# Patient Record
Sex: Male | Born: 1955 | Race: White | Hispanic: No | Marital: Single | Smoking: Never smoker
Health system: Southern US, Community
[De-identification: ages and names within clinical notes are randomized; demographics above are authoritative.]

## PROBLEM LIST (undated history)

## (undated) DIAGNOSIS — E876 Hypokalemia: Secondary | ICD-10-CM

## (undated) DIAGNOSIS — I5022 Chronic systolic (congestive) heart failure: Secondary | ICD-10-CM

## (undated) DIAGNOSIS — R7989 Other specified abnormal findings of blood chemistry: Secondary | ICD-10-CM

## (undated) DIAGNOSIS — I4819 Other persistent atrial fibrillation: Secondary | ICD-10-CM

## (undated) DIAGNOSIS — I4891 Unspecified atrial fibrillation: Secondary | ICD-10-CM

## (undated) DIAGNOSIS — I10 Essential (primary) hypertension: Secondary | ICD-10-CM

## (undated) DIAGNOSIS — L409 Psoriasis, unspecified: Secondary | ICD-10-CM

## (undated) DIAGNOSIS — I160 Hypertensive urgency: Secondary | ICD-10-CM

## (undated) HISTORY — DX: Hypertensive urgency: I16.0

## (undated) HISTORY — DX: Unspecified atrial fibrillation: I48.91

## (undated) HISTORY — DX: Hypokalemia: E87.6

## (undated) HISTORY — PX: KNEE ARTHROCENTESIS: SUR44

## (undated) HISTORY — DX: Other specified abnormal findings of blood chemistry: R79.89

## (undated) HISTORY — DX: Chronic systolic (congestive) heart failure: I50.22

---

## 2001-02-24 ENCOUNTER — Encounter: Admission: RE | Admit: 2001-02-24 | Discharge: 2001-02-24 | Payer: Self-pay

## 2001-02-26 ENCOUNTER — Ambulatory Visit (HOSPITAL_BASED_OUTPATIENT_CLINIC_OR_DEPARTMENT_OTHER): Admission: RE | Admit: 2001-02-26 | Discharge: 2001-02-26 | Payer: Self-pay

## 2016-12-28 ENCOUNTER — Encounter (HOSPITAL_BASED_OUTPATIENT_CLINIC_OR_DEPARTMENT_OTHER): Payer: Self-pay

## 2016-12-28 ENCOUNTER — Observation Stay (HOSPITAL_BASED_OUTPATIENT_CLINIC_OR_DEPARTMENT_OTHER)
Admission: EM | Admit: 2016-12-28 | Discharge: 2016-12-29 | Disposition: A | Discharge status: Home / Self Care | Payer: Self-pay | Attending: Internal Medicine | Admitting: Internal Medicine

## 2016-12-28 ENCOUNTER — Emergency Department (HOSPITAL_BASED_OUTPATIENT_CLINIC_OR_DEPARTMENT_OTHER): Payer: Self-pay

## 2016-12-28 DIAGNOSIS — I4891 Unspecified atrial fibrillation: Principal | ICD-10-CM | POA: Insufficient documentation

## 2016-12-28 DIAGNOSIS — I4819 Other persistent atrial fibrillation: Secondary | ICD-10-CM | POA: Insufficient documentation

## 2016-12-28 DIAGNOSIS — I5022 Chronic systolic (congestive) heart failure: Secondary | ICD-10-CM

## 2016-12-28 DIAGNOSIS — R778 Other specified abnormalities of plasma proteins: Secondary | ICD-10-CM | POA: Diagnosis present

## 2016-12-28 DIAGNOSIS — I160 Hypertensive urgency: Secondary | ICD-10-CM | POA: Diagnosis present

## 2016-12-28 DIAGNOSIS — R7989 Other specified abnormal findings of blood chemistry: Secondary | ICD-10-CM | POA: Diagnosis present

## 2016-12-28 DIAGNOSIS — E876 Hypokalemia: Secondary | ICD-10-CM

## 2016-12-28 HISTORY — DX: Other persistent atrial fibrillation: I48.19

## 2016-12-28 HISTORY — DX: Other specified abnormalities of plasma proteins: R77.8

## 2016-12-28 HISTORY — DX: Essential (primary) hypertension: I10

## 2016-12-28 HISTORY — DX: Hypokalemia: E87.6

## 2016-12-28 HISTORY — DX: Unspecified atrial fibrillation: I48.91

## 2016-12-28 HISTORY — DX: Hypertensive urgency: I16.0

## 2016-12-28 HISTORY — DX: Psoriasis, unspecified: L40.9

## 2016-12-28 LAB — CBC WITH DIFFERENTIAL/PLATELET
BASOS PCT: 1 %
Basophils Absolute: 0.1 10*3/uL (ref 0.0–0.1)
EOS ABS: 0.2 10*3/uL (ref 0.0–0.7)
EOS PCT: 3 %
HCT: 48.8 % (ref 39.0–52.0)
Hemoglobin: 16.7 g/dL (ref 13.0–17.0)
Lymphocytes Relative: 27 %
Lymphs Abs: 2.3 10*3/uL (ref 0.7–4.0)
MCH: 28.7 pg (ref 26.0–34.0)
MCHC: 34.2 g/dL (ref 30.0–36.0)
MCV: 83.8 fL (ref 78.0–100.0)
MONO ABS: 0.8 10*3/uL (ref 0.1–1.0)
MONOS PCT: 9 %
NEUTROS PCT: 60 %
Neutro Abs: 5.1 10*3/uL (ref 1.7–7.7)
PLATELETS: 279 10*3/uL (ref 150–400)
RBC: 5.82 MIL/uL — ABNORMAL HIGH (ref 4.22–5.81)
RDW: 14.3 % (ref 11.5–15.5)
WBC: 8.5 10*3/uL (ref 4.0–10.5)

## 2016-12-28 LAB — COMPREHENSIVE METABOLIC PANEL
ALBUMIN: 4.4 g/dL (ref 3.5–5.0)
ALT: 27 U/L (ref 17–63)
ANION GAP: 8 (ref 5–15)
AST: 31 U/L (ref 15–41)
Alkaline Phosphatase: 96 U/L (ref 38–126)
BUN: 15 mg/dL (ref 6–20)
CO2: 31 mmol/L (ref 22–32)
Calcium: 9.5 mg/dL (ref 8.9–10.3)
Chloride: 100 mmol/L — ABNORMAL LOW (ref 101–111)
Creatinine, Ser: 1.08 mg/dL (ref 0.61–1.24)
GFR calc non Af Amer: >60 mL/min (ref >60)
GLUCOSE: 105 mg/dL — AB (ref 65–99)
POTASSIUM: 3.4 mmol/L — AB (ref 3.5–5.1)
SODIUM: 139 mmol/L (ref 135–145)
Total Bilirubin: 1.8 mg/dL — ABNORMAL HIGH (ref 0.3–1.2)
Total Protein: 7.7 g/dL (ref 6.5–8.1)

## 2016-12-28 LAB — HEPARIN LEVEL (UNFRACTIONATED): HEPARIN UNFRACTIONATED: 0.39 [IU]/mL (ref 0.30–0.70)

## 2016-12-28 LAB — TROPONIN I
TROPONIN I: 0.07 ng/mL — AB (ref <0.03)
Troponin I: 0.06 ng/mL (ref <0.03)

## 2016-12-28 LAB — TSH: TSH: 1.911 u[IU]/mL (ref 0.350–4.500)

## 2016-12-28 LAB — MAGNESIUM: Magnesium: 1.8 mg/dL (ref 1.7–2.4)

## 2016-12-28 MED ORDER — HEPARIN BOLUS VIA INFUSION
5000.0000 [IU] | Freq: Once | INTRAVENOUS | Status: AC
  Administered 2016-12-28: 5000 [IU] via INTRAVENOUS

## 2016-12-28 MED ORDER — ADULT MULTIVITAMIN W/MINERALS CH
1.0000 | ORAL_TABLET | Freq: Every day | ORAL | Status: DC
  Filled 2016-12-28: qty 1

## 2016-12-28 MED ORDER — METOPROLOL TARTRATE 25 MG PO TABS
25.0000 mg | ORAL_TABLET | Freq: Two times a day (BID) | ORAL | Status: DC
  Administered 2016-12-29 (×2): 25 mg via ORAL
  Filled 2016-12-28 (×2): qty 1

## 2016-12-28 MED ORDER — VITAMIN D 1000 UNITS PO TABS
1000.0000 [IU] | ORAL_TABLET | Freq: Every day | ORAL | Status: DC
  Administered 2016-12-29: 1000 [IU] via ORAL
  Filled 2016-12-28: qty 1

## 2016-12-28 MED ORDER — HYDRALAZINE HCL 20 MG/ML IJ SOLN
10.0000 mg | INTRAMUSCULAR | Status: DC | PRN
  Administered 2016-12-28 – 2016-12-29 (×2): 10 mg via INTRAVENOUS
  Filled 2016-12-28 (×2): qty 1

## 2016-12-28 MED ORDER — POTASSIUM CHLORIDE CRYS ER 20 MEQ PO TBCR
40.0000 meq | EXTENDED_RELEASE_TABLET | Freq: Once | ORAL | Status: AC
  Administered 2016-12-28: 40 meq via ORAL
  Filled 2016-12-28: qty 2

## 2016-12-28 MED ORDER — ACETAMINOPHEN 325 MG PO TABS
650.0000 mg | ORAL_TABLET | ORAL | Status: DC | PRN
Start: 2016-12-28 — End: 2016-12-29

## 2016-12-28 MED ORDER — HYDROCODONE-ACETAMINOPHEN 5-325 MG PO TABS: 1.0000 | ORAL_TABLET | ORAL | Status: DC | PRN

## 2016-12-28 MED ORDER — ZOLPIDEM TARTRATE 5 MG PO TABS: 5.0000 mg | ORAL_TABLET | Freq: Every evening | ORAL | Status: DC | PRN

## 2016-12-28 MED ORDER — ONDANSETRON HCL 4 MG/2ML IJ SOLN: 4.0000 mg | Freq: Four times a day (QID) | INTRAMUSCULAR | Status: DC | PRN

## 2016-12-28 MED ORDER — SODIUM CHLORIDE 0.9% FLUSH: 3.0000 mL | INTRAVENOUS | Status: DC | PRN

## 2016-12-28 MED ORDER — METOPROLOL TARTRATE 5 MG/5ML IV SOLN
2.5000 mg | Freq: Once | INTRAVENOUS | Status: AC
  Administered 2016-12-28: 2.5 mg via INTRAVENOUS
  Filled 2016-12-28: qty 5

## 2016-12-28 MED ORDER — SODIUM CHLORIDE 0.9 % IV SOLN: 250.0000 mL | INTRAVENOUS | Status: DC | PRN

## 2016-12-28 MED ORDER — SODIUM CHLORIDE 0.9% FLUSH
3.0000 mL | Freq: Two times a day (BID) | INTRAVENOUS | Status: DC
  Administered 2016-12-28: 3 mL via INTRAVENOUS

## 2016-12-28 MED ORDER — ALPRAZOLAM 0.25 MG PO TABS: 0.2500 mg | ORAL_TABLET | Freq: Two times a day (BID) | ORAL | Status: DC | PRN

## 2016-12-28 MED ORDER — HEPARIN (PORCINE) IN NACL 100-0.45 UNIT/ML-% IJ SOLN
1500.0000 [IU]/h | INTRAMUSCULAR | Status: DC
  Administered 2016-12-28 (×2): 1500 [IU]/h via INTRAVENOUS
  Filled 2016-12-28 (×2): qty 250

## 2016-12-28 MED ORDER — METOPROLOL TARTRATE 50 MG PO TABS
25.0000 mg | ORAL_TABLET | Freq: Once | ORAL | Status: AC
  Administered 2016-12-28: 25 mg via ORAL
  Filled 2016-12-28: qty 1

## 2016-12-28 NOTE — ED Notes (Signed)
Report given to [facility] and nurse Sam, at [facility]

## 2016-12-28 NOTE — H&P (Addendum)
History and Physical    Cory [name] YFV:[id number] DOB: Apr 16, 1956 DOA: 12/28/2016  PCP: Patient, No Pcp Per confirmed  Patient coming from: Home, by way of [facility] ED  Chief Complaint: Fatigue, DOE   HPI: Cory [name] is a 61 y.o. male who denies any significant past medical history, now presenting to the emergency department for evaluation of exertional dyspnea, fatigue, and cough. He reports progressive exertional dyspnea and fatigue over the past several months, particularly worse over the past 10 days or so. He is also developed a cough productive of clear sputum over that interval. He was evaluated in an urgent care earlier today, noted to be in atrial fibrillation and very hypertensive, and was directed to the ED for further evaluation. He denies any recent fevers or chills, denies chest pain, and denies palpitations. There is no history of atrial fibrillation. Denies alcohol use. Takes vitamins, but no other medications. Has never been diagnosed with hypertension.  [facility] ED Course: Upon arrival to the [facility] ED, patient is found to be afebrile, saturating well on room air, hypertensive to 190/150, and with vitals otherwise stable. EKG features atrial fibrillation with left anterior fascicular block. Chest x-ray is negative for acute cardiopulmonary disease. Chemistry panels notable for a potassium of 3.4 and total bilirubin of 1.8. CBC is unremarkable. Troponin is slightly elevated to 0.06. Patient was started on heparin infusion in the ED, treated with 2.5 mg IV Lopressor, and later with 25 mg oral Lopressor. Cardiology was consulted by the ED physician and recommended a medical admission for further evaluation. Patient's heart rate remained in the 90s to low 100s and his blood pressure remained elevated, but improved after the beta blocker. There has not been any chest pain or respiratory distress. Patient will be observed in the telemetry unit for ongoing evaluation and  management of new onset atrial fibrillation and hypertension with hypertensive urgency and a mild elevation in troponin without chest pain.  Review of Systems:  All other systems reviewed and apart from HPI, are negative.  Past Medical History:  Diagnosis Date  . Medical history non-contributory     Past Surgical History:  Procedure Laterality Date  . KNEE ARTHROCENTESIS       reports that he has never smoked. He has never used smokeless tobacco. He reports that he does not drink alcohol or use drugs.  No Known Allergies  Family History  Problem Relation Age of Onset  . Diabetes Mother   . GI Disease Mother   . Obesity Sister      Prior to Admission medications   Medication Sig Start Date End Date Taking? Authorizing Provider  cholecalciferol (VITAMIN D) 1000 units tablet Take 1,000 Units by mouth daily.   Yes [provider]  Multiple Vitamin (MULTIVITAMIN WITH MINERALS) TABS tablet Take 1 tablet by mouth daily.   Yes [provider]    Physical Exam: Vitals:   12/28/16 1712 12/28/16 1730 12/28/16 1745 12/28/16 1845  BP: (!) 165/127 (!) 159/143 (!) 168/126 (!) 161/132  Pulse: 87 90 88 93  Resp: (!) 25 (!) 25 (!) 22 16  Temp: 98.3 F (36.8 C)  98.3 F (36.8 C) 98.8 F (37.1 C)  TempSrc: Oral  Oral Oral  SpO2: 96% 99% 98% 98%  Weight:    112.8 kg (248 lb 11.2 oz)  Height:    6\' 1"  (1.854 m)      Constitutional: NAD, calm, comfortable Eyes: PERTLA, lids and conjunctivae normal ENMT: Mucous  membranes are moist. Posterior pharynx clear of any exudate or lesions.   Neck: normal, supple, no masses, no thyromegaly Respiratory: clear to auscultation bilaterally, no wheezing, no crackles. Normal respiratory effort.   Cardiovascular: Rate ~100 and irregular. No extremity edema. No significant JVD. Abdomen: No distension, no tenderness, no masses palpated. Bowel sounds normal.  Musculoskeletal: no clubbing / cyanosis. No joint deformity upper and lower  extremities.  Skin: no significant rashes, lesions, ulcers. Warm, dry, well-perfused. Neurologic: CN 2-12 grossly intact. Sensation intact, DTR normal. Strength 5/5 in all 4 limbs.  Psychiatric: Alert and oriented x 3. Calm, cooperative.     Labs on Admission: I have personally reviewed following labs and imaging studies  CBC:  Recent Labs Lab 12/28/16 1127  WBC 8.5  NEUTROABS 5.1  HGB 16.7  HCT 48.8  MCV 83.8  PLT 279   Basic Metabolic Panel:  Recent Labs Lab 12/28/16 1127  NA 139  K 3.4*  CL 100*  CO2 31  GLUCOSE 105*  BUN 15  CREATININE 1.08  CALCIUM 9.5   GFR: Estimated Creatinine Clearance: 95.8 mL/min (by C-G formula based on SCr of 1.08 mg/dL). Liver Function Tests:  Recent Labs Lab 12/28/16 1127  AST 31  ALT 27  ALKPHOS 96  BILITOT 1.8*  PROT 7.7  ALBUMIN 4.4   No results for input(s): LIPASE, AMYLASE in the last 168 hours. No results for input(s): AMMONIA in the last 168 hours. Coagulation Profile: No results for input(s): INR, PROTIME in the last 168 hours. Cardiac Enzymes:  Recent Labs Lab 12/28/16 1127  TROPONINI 0.06*   BNP (last 3 results) No results for input(s): PROBNP in the last 8760 hours. HbA1C: No results for input(s): HGBA1C in the last 72 hours. CBG: No results for input(s): GLUCAP in the last 168 hours. Lipid Profile: No results for input(s): CHOL, HDL, LDLCALC, TRIG, CHOLHDL, LDLDIRECT in the last 72 hours. Thyroid Function Tests: No results for input(s): TSH, T4TOTAL, FREET4, T3FREE, THYROIDAB in the last 72 hours. Anemia Panel: No results for input(s): VITAMINB12, FOLATE, FERRITIN, TIBC, IRON, RETICCTPCT in the last 72 hours. Urine analysis: No results found for: COLORURINE, APPEARANCEUR, LABSPEC, PHURINE, GLUCOSEU, HGBUR, BILIRUBINUR, KETONESUR, PROTEINUR, UROBILINOGEN, NITRITE, LEUKOCYTESUR Sepsis Labs: @LABRCNTIP (procalcitonin:4,lacticidven:4) )No results found for this or any previous visit (from the past 240  hour(s)).   Radiological Exams on Admission: Dg Chest 2 View  Result Date: 12/28/2016 CLINICAL DATA:  Cough EXAM: CHEST  2 VIEW COMPARISON:  None. FINDINGS: Lungs are clear.  No pleural effusion or pneumothorax. Mild cardiomegaly. Mild degenerative changes of the visualized thoracolumbar spine. IMPRESSION: No evidence of acute cardiopulmonary disease. Electronically Signed   By: Charline Bills M.D.   On: 12/28/2016 12:35    EKG: Independently reviewed. Atrial fibrillation, LAFB.   Assessment/Plan  1. New-onset atrial fibrillation   - Pt presents with months of progressive DOE and fatigue, denies chest pain or palpitations  - Noted to be atrial fibrillation with HR 90-115 - CHADS-VASc may only be 1 for HTN - He was started on heparin infusion in the ED and cardiology consultant recommended medical admission - Mild hypokalemia noted on initial labs, will check magnesium, TSH, and echocardiogram - Continue cardiac monitoring, daily CBC while on heparin infusion   2. Elevated troponin   - Troponin was 0.06 in the ED without chest pain  - Plan to continue cardiac monitoring, continue heparin infusion, trend troponin measurements, repeat EKG, control BP with beta-blocker, and check echocardiography    3. Hypertensive urgency  -  BP elevated to 190/140 range in ED  - Denies hx of HTN and not on any medications   - Treated with IV and oral Lopressor in ED with some improvement  - Continue Lopressor BID, use hydralazine IVP's prn    4. Hypokalemia  - Serum potassium is 3.4 on admission - Check magnesium level given new arrhythmia  - Treat with 40 mEq oral potassium, continue cardiac monitoring, repeat chem panel in am   5. Chronic cough   - Worse at night when laying down, present for ~10 mos  - No hypoxia, auscultation unremarkable  - Not on ACE   - Possibly secondary to GERD, will trial daily Protonix    DVT prophylaxis: IV heparin infusion  Code Status: Full  Family  Communication: Discussed with patient Disposition Plan: Observe on telemetry Consults called: Cardiology Admission status: Observation    Briscoe Deutscher, MD [facility] Pager [phone]  If 7PM-7AM, please contact night-coverage [email or web address] Password [facility]  12/28/2016, 8:31 PM

## 2016-12-28 NOTE — ED Provider Notes (Signed)
[facility] Provider Note   CSN: [id number] Arrival date & time: 12/28/16  1114     History   Chief Complaint Chief Complaint  Patient presents with  . Cough    HPI Cory [name] is a 61 y.o. male.  The history is provided by the patient. No language interpreter was used.  Cough     Cory [name] is a 61 y.o. male who presents to the Emergency Department complaining of cough.  She presents after referral from urgent care for evaluation of facial fibrillation. He states that over the last several months he has experienced increase in dyspnea with exercise intolerance. He attributed the symptoms to being out of shape. His shortness of breath completely resolved about 3 weeks ago. 10 days ago he developed a cough that is productive of occasional phlegm. He went to urgent care today for evaluation of this cough when he was noted to be in atrial fibrillation. He denies any fevers, night sweats, weight loss, chest pain, abdominal pain, leg swelling or pain. He does have a history of Hodgkin's lymphoma that was treated in 2002 and this is thought to be in remission. He has no history of hypertension but did have a blood pressure 140/90 and his last doctor's visit 2 years ago.  History reviewed. No pertinent past medical history.  Patient Active Problem List   Diagnosis Date Noted  . Atrial fibrillation with controlled ventricular rate (HCC) 12/28/2016    Past Surgical History:  Procedure Laterality Date  . KNEE ARTHROCENTESIS         Home Medications    Prior to Admission medications   Not on File    Family History History reviewed. No pertinent family history.  Social History Social History  Substance Use Topics  . Smoking status: Never Smoker  . Smokeless tobacco: Never Used  . Alcohol use No     Allergies   Patient has no known allergies.   Review of Systems Review of Systems  Respiratory: Positive for cough.   All other systems reviewed  and are negative.    Physical Exam Updated Vital Signs BP (!) 167/128 (BP Location: Right Arm)   Pulse 93   Temp 98.4 F (36.9 C) (Oral)   Resp 17   Ht 6\' 1"  (1.854 m)   Wt 113.4 kg (250 lb)   SpO2 99%   BMI 32.98 kg/m   Physical Exam  Constitutional: He is oriented to person, place, and time. He appears well-developed and well-nourished.  HENT:  Head: Normocephalic and atraumatic.  Cardiovascular:  No murmur heard. Tachycardic, irregular  Pulmonary/Chest: Effort normal and breath sounds normal. No respiratory distress.  Abdominal: Soft. There is no tenderness. There is no rebound and no guarding.  Musculoskeletal: He exhibits no tenderness.  Trace pitting edema to BLE  Neurological: He is alert and oriented to person, place, and time.  Skin: Skin is warm and dry.  Psychiatric: He has a normal mood and affect. His behavior is normal.  Nursing note and vitals reviewed.    ED Treatments / Results  Labs (all labs ordered are listed, but only abnormal results are displayed) Labs Reviewed  CBC WITH DIFFERENTIAL/PLATELET - Abnormal; Notable for the following:       Result Value   RBC 5.82 (*)    All other components within normal limits  COMPREHENSIVE METABOLIC PANEL - Abnormal; Notable for the following:    Potassium 3.4 (*)    Chloride 100 (*)  Glucose, Bld 105 (*)    Total Bilirubin 1.8 (*)    All other components within normal limits  TROPONIN I - Abnormal; Notable for the following:    Troponin I 0.06 (*)    All other components within normal limits  HEPARIN LEVEL (UNFRACTIONATED)    EKG  EKG Interpretation  Date/Time:  Saturday December 28 2016 11:22:44 EDT Ventricular Rate:  111 PR Interval:    QRS Duration: 101 QT Interval:  354 QTC Calculation: 481 R Axis:   -62 Text Interpretation:  Atrial fibrillation Left anterior fascicular block Abnormal R-wave progression, late transition Probable left ventricular hypertrophy Borderline prolonged QT interval  Confirmed by Tilden Fossa [phone]) on 12/28/2016 11:35:47 AM       Radiology Dg Chest 2 View  Result Date: 12/28/2016 CLINICAL DATA:  Cough EXAM: CHEST  2 VIEW COMPARISON:  None. FINDINGS: Lungs are clear.  No pleural effusion or pneumothorax. Mild cardiomegaly. Mild degenerative changes of the visualized thoracolumbar spine. IMPRESSION: No evidence of acute cardiopulmonary disease. Electronically Signed   By: Charline Bills M.D.   On: 12/28/2016 12:35    Procedures Procedures (including critical care time)  Medications Ordered in ED Medications  heparin ADULT infusion 100 units/mL (25000 units/2106mL sodium chloride 0.45%) (1,500 Units/hr Intravenous New Bag/Given 12/28/16 1422)  heparin bolus via infusion 5,000 Units (5,000 Units Intravenous Bolus from Bag 12/28/16 1420)  metoprolol tartrate (LOPRESSOR) injection 2.5 mg (2.5 mg Intravenous Given 12/28/16 1424)  metoprolol tartrate (LOPRESSOR) tablet 25 mg (25 mg Oral Given 12/28/16 1454)     Initial Impression / Assessment and Plan / ED Course  I have reviewed the triage vital signs and the nursing notes.  Pertinent labs & imaging results that were available during my care of the patient were reviewed by me and considered in my medical decision making (see chart for details).     Patient in the ED after a fall from urgent care for new onset A. fib. His only symptom is cough for the last 10 days. He is well appearing on examination. He is in atrial fibrillation, occasionally his rate goes up to the 1 teens but he is mostly in the 90s. He is hypertensive in the ED, no reported history of hypertension. He was treated with metoprolol for rate control as well as his blood pressure. After one IV dose of metoprolol his blood pressure improved to the 170s systolic. He was given additional dose of oral metoprolol with improvement in his blood pressure to the 160s. He does have a mild elevation in his troponin to 0.06. He has no chest pain or  ischemic changes on his EKG. Presentation is not consistent with pneumonia, PE, ACS. Discussed with Dr. Mayford Knife with cardiology, recommended medicine admission for further treatment of new onset A. fib. Hospitalist consulted for admission.   Patient updated of findings of studies and recommendation for admission for further treatment and he is in agreement with plan. Final Clinical Impressions(s) / ED Diagnoses   Final diagnoses:  Rapid atrial fibrillation [facility])    New Prescriptions New Prescriptions   No medications on file     Tilden Fossa, MD 12/28/16 1624

## 2016-12-28 NOTE — ED Triage Notes (Signed)
Pt reports 2 week history of cough, clear sputum - states seen at [facility] and dx with A FIB and HTN (new onset for both).

## 2016-12-28 NOTE — ED Notes (Signed)
ED MD informed of Troponin 0.06

## 2016-12-28 NOTE — Progress Notes (Signed)
ANTICOAGULATION CONSULT NOTE - Initial Consult  Pharmacy Consult for heparin Indication: chest pain/ACS  No Known Allergies  Patient Measurements: Height: 6\' 1"  (185.4 cm) Weight: 250 lb (113.4 kg) IBW/kg (Calculated) : 79.9 Heparin Dosing Weight: 104kg  Vital Signs: Temp: 98.2 F (36.8 C) (08/25 1122) Temp Source: Oral (08/25 1122) BP: 182/132 (08/25 1202) Pulse Rate: 49 (08/25 1130)  Labs:  Recent Labs  12/28/16 1127  HGB 16.7  HCT 48.8  PLT 279  CREATININE 1.08  TROPONINI 0.06*    Estimated Creatinine Clearance: 96 mL/min (by C-G formula based on SCr of 1.08 mg/dL).   Medical History: History reviewed. No pertinent past medical history.   Assessment: 61 YOM seen at [facility] - seen at urgent care today and sent to ED with new onset AFib and HTN.  hgb 16.7, plts 279- no bleeding noted.  Goal of Therapy:  Heparin level 0.3-0.7 units/ml Monitor platelets by anticoagulation protocol: Yes   Plan:  -heparin 5000 unit bolus IV x1, then start infusion at 1500 units/hr -heparin level in 6 hours -daily heparin level and CBC  Hezikiah Retzloff D. Dinara Lupu, PharmD, BCPS Clinical Pharmacist Pager: [phone] Clinical Phone for 12/28/2016 until 3:30pm: [phone] If after 3:30pm, please call main pharmacy at [phone] 12/28/2016 12:44 PM

## 2016-12-28 NOTE — ED Notes (Addendum)
Pt Car keys labeled and taken to Security. Cory [name] and Cory [name] coming later in the day to get his vehicle.

## 2016-12-29 ENCOUNTER — Other Ambulatory Visit: Payer: Self-pay | Admitting: Physician Assistant

## 2016-12-29 ENCOUNTER — Observation Stay (HOSPITAL_BASED_OUTPATIENT_CLINIC_OR_DEPARTMENT_OTHER): Payer: Self-pay

## 2016-12-29 ENCOUNTER — Encounter (HOSPITAL_COMMUNITY): Payer: Self-pay | Admitting: Physician Assistant

## 2016-12-29 DIAGNOSIS — I160 Hypertensive urgency: Secondary | ICD-10-CM

## 2016-12-29 DIAGNOSIS — I340 Nonrheumatic mitral (valve) insufficiency: Secondary | ICD-10-CM

## 2016-12-29 DIAGNOSIS — E876 Hypokalemia: Secondary | ICD-10-CM

## 2016-12-29 DIAGNOSIS — I481 Persistent atrial fibrillation: Secondary | ICD-10-CM

## 2016-12-29 DIAGNOSIS — R0683 Snoring: Secondary | ICD-10-CM

## 2016-12-29 DIAGNOSIS — R748 Abnormal levels of other serum enzymes: Secondary | ICD-10-CM

## 2016-12-29 DIAGNOSIS — I361 Nonrheumatic tricuspid (valve) insufficiency: Secondary | ICD-10-CM

## 2016-12-29 LAB — LIPID PANEL
CHOL/HDL RATIO: 3.1 ratio
CHOLESTEROL: 162 mg/dL (ref 0–200)
HDL: 52 mg/dL (ref >40)
LDL Cholesterol: 99 mg/dL (ref 0–99)
TRIGLYCERIDES: 53 mg/dL (ref <150)
VLDL: 11 mg/dL (ref 0–40)

## 2016-12-29 LAB — BASIC METABOLIC PANEL
Anion gap: 9 (ref 5–15)
BUN: 9 mg/dL (ref 6–20)
CO2: 26 mmol/L (ref 22–32)
CREATININE: 1.02 mg/dL (ref 0.61–1.24)
Calcium: 9.2 mg/dL (ref 8.9–10.3)
Chloride: 102 mmol/L (ref 101–111)
GLUCOSE: 104 mg/dL — AB (ref 65–99)
Potassium: 3.6 mmol/L (ref 3.5–5.1)
Sodium: 137 mmol/L (ref 135–145)

## 2016-12-29 LAB — ECHOCARDIOGRAM COMPLETE
Ao-asc: 38 cm
CHL CUP MV DEC (S): 162
CHL CUP RV SYS PRESS: 50 mmHg
CHL CUP TV REG PEAK VELOCITY: 297 cm/s
E decel time: 162 msec
FS: 21 % — AB (ref 28–44)
HEIGHTINCHES: 73 in
IV/PV OW: 0.87
LA ID, A-P, ES: 61 mm
LA vol A4C: 156 ml
LA vol index: 62.5 mL/m2
LA vol: 150 mL
LADIAMINDEX: 2.54 cm/m2
LEFT ATRIUM END SYS DIAM: 61 mm
LV PW d: 17.5 mm — AB (ref 0.6–1.1)
LVOT area: 3.8 cm2
LVOT diameter: 22 mm
MRPISAEROA: 0.11 cm2
MV Peak grad: 6 mmHg
MV VTI: 174 cm
MVPKAVEL: 36.9 m/s
MVPKEVEL: 123 m/s
TAPSE: 22.6 mm
TRMAXVEL: 297 cm/s
WEIGHTICAEL: 3848 [oz_av]

## 2016-12-29 LAB — HEMOGLOBIN A1C
HEMOGLOBIN A1C: 5.5 % (ref 4.8–5.6)
Mean Plasma Glucose: 111.15 mg/dL

## 2016-12-29 LAB — TROPONIN I: Troponin I: 0.05 ng/mL (ref <0.03)

## 2016-12-29 LAB — HIV ANTIBODY (ROUTINE TESTING W REFLEX): HIV SCREEN 4TH GENERATION: NONREACTIVE

## 2016-12-29 LAB — HEPARIN LEVEL (UNFRACTIONATED): Heparin Unfractionated: 0.61 IU/mL (ref 0.30–0.70)

## 2016-12-29 MED ORDER — LISINOPRIL 5 MG PO TABS: 5.0000 mg | ORAL_TABLET | Freq: Every day | ORAL | 1 refills | Status: DC

## 2016-12-29 MED ORDER — RIVAROXABAN 20 MG PO TABS
20.0000 mg | ORAL_TABLET | Freq: Every day | ORAL | Status: DC
  Administered 2016-12-29: 20 mg via ORAL
  Filled 2016-12-29: qty 1

## 2016-12-29 MED ORDER — GUAIFENESIN-DM 100-10 MG/5ML PO SYRP
5.0000 mL | ORAL_SOLUTION | ORAL | Status: DC | PRN
  Administered 2016-12-29: 5 mL via ORAL
  Filled 2016-12-29: qty 5

## 2016-12-29 MED ORDER — RIVAROXABAN 20 MG PO TABS: 20.0000 mg | ORAL_TABLET | Freq: Every day | ORAL | 11 refills | Status: DC

## 2016-12-29 MED ORDER — RIVAROXABAN 20 MG PO TABS: 20.0000 mg | ORAL_TABLET | Freq: Every day | ORAL | 0 refills | Status: DC

## 2016-12-29 MED ORDER — METOPROLOL TARTRATE 25 MG PO TABS: 25.0000 mg | ORAL_TABLET | Freq: Two times a day (BID) | ORAL | 0 refills | Status: DC

## 2016-12-29 NOTE — Plan of Care (Signed)
Problem: Physical Regulation: Goal: Ability to maintain clinical measurements within normal limits will improve Outcome: Progressing PRN Hydralazine given for elevated BP,current  BP 151/105.

## 2016-12-29 NOTE — Progress Notes (Signed)
ANTICOAGULATION CONSULT NOTE - Follow-up  Pharmacy Consult for heparin Indication: chest pain/ACS  No Known Allergies  Patient Measurements: Height: 6\' 1"  (185.4 cm) Weight: 240 lb 8 oz (109.1 kg) IBW/kg (Calculated) : 79.9 Heparin Dosing Weight: 104kg  Vital Signs: Temp: 97.7 F (36.5 C) (08/26 0424) BP: 151/105 (08/26 0610) Pulse Rate: 80 (08/26 0424)  Labs:  Recent Labs  12/28/16 1127 12/28/16 1937 12/28/16 2210 12/29/16 0100  HGB 16.7  --   --   --   HCT 48.8  --   --   --   PLT 279  --   --   --   HEPARINUNFRC  --   --  0.39 0.61  CREATININE 1.08  --   --   --   TROPONINI 0.06* 0.07*  --   --     Estimated Creatinine Clearance: 94.2 mL/min (by C-G formula based on SCr of 1.08 mg/dL).   Medical History: Past Medical History:  Diagnosis Date  . Medical history non-contributory      Assessment: 68 YOM seen at [facility] - seen at urgent care yesterday and sent to ED with new onset AFib and HTN.  Heparin level therapeutic x2 post-initiation.   CBC wnl, no bleeding noted.  Goal of Therapy:  Heparin level 0.3-0.7 units/ml Monitor platelets by anticoagulation protocol: Yes   Plan:  -continue heparin gtt 1500 units/hr -daily heparin level and CBC -monitor for s/s of bleeding  Adline Potter, PharmD Pharmacy Resident Pager: [phone]

## 2016-12-29 NOTE — Progress Notes (Addendum)
ECHO results reviewed with Dr Antoine Poche.  As he is asymptomatic (with a reduced EF) and his HR is controlled. And as he has no WMA despite the reduced EF, it is ok to d/c him on the Xarelto and the BB. We will arrange f/u in the office.   ECHO: 12/29/2016 - Left ventricle: The cavity size was mildly dilated. Wall   thickness was increased in a pattern of severe LVH. There was   moderate concentric hypertrophy. Systolic function was moderately   to severely reduced. The estimated ejection fraction was in the   range of 30% to 35%. Wall motion was normal; there were no   regional wall motion abnormalities. Doppler parameters are   consistent with restrictive physiology, indicative of decreased   left ventricular diastolic compliance and/or increased left   atrial pressure. - Aortic valve: Trileaflet; normal thickness leaflets. There was no   regurgitation. - Aortic root: The aortic root was normal in size. - Mitral valve: There was mild regurgitation. - Left atrium: The atrium was severely dilated. - Right ventricle: The cavity size was normal. Wall thickness was   normal. Systolic function was normal. - Right atrium: The atrium was normal in size. - Tricuspid valve: There was mild regurgitation. - Pulmonary arteries: Systolic pressure was moderately increased.   PA peak pressure: 50 mm Hg (S). - Inferior vena cava: The vessel was normal in size. - Pericardium, extracardiac: A trivial pericardial effusion was   identified posterior to the heart. Features were not consistent   with tamponade physiology.  Leanna Battles 12/29/2016 2:49 PM Beeper [phone]

## 2016-12-29 NOTE — Progress Notes (Signed)
  Echocardiogram 2D Echocardiogram has been performed.  Cory [name] 12/29/2016, 10:43 AM

## 2016-12-29 NOTE — Discharge Summary (Addendum)
Physician Discharge Summary  Cory [name] ZOX:[id number] DOB: 07/29/1955 DOA: 12/28/2016  PCP: Patient, No Pcp Per  Admit date: 12/28/2016 Discharge date: 12/30/2016  Admitted From: home Disposition:  home   Recommendations for Outpatient Follow-up:  1. Obtain insurance and Find a PCP  Home Health:  none  Equipment/Devices:  none    Discharge Condition:  stable   CODE STATUS:  Full code   Consultations:  Cardiology     Discharge Diagnoses:  Principal Problem:   Atrial fibrillation with controlled ventricular rate (HCC) Active Problems:   Hypertensive urgency   Elevated troponin   Hypokalemia   Chronic systolic CHF (congestive heart failure) (HCC)    Subjective: No complaints other than a dry cough and dyspnea when he exerts himself  Brief Summary: Cory [name] is a 61 y.o. male who denies any significant past medical history who went to urgent care for a cough present for 10 days. He was found to have rate controlled A-fib and sent to the ER. He complains of dyspnea on exertion but no palpitations or chest pain.   Hospital Course:  A-fib - rate in 70-80s in hospital - no symptoms of palpitations - normal TSH - see ECHO below - CHA2DS2-VASc Score 2 -  placed on Xarelto -  plans to DCCV in a few weeks - added Metoprolol as well  Chronic systolic and probably diastolic CHF - EF found to be 81-19%- doppler suggestive of restrictive physiology with L atrium dilated -  have added and ACE I to BB- cardiology does not recommend further work up at this time -  follow up ECHO to be done by cardiology - discussed with patient  Cough - symptomatic management - no need for antibiotics or inhalers at this time  Hypokalemia - replaced  Mildly elevated troponin - no further work up at this time - Lipid panel and A1c normal  Discharge Instructions  Discharge Instructions    Diet - low sodium heart healthy    Complete by:  As directed    Increase activity slowly     Complete by:  As directed      Allergies as of 12/29/2016   No Known Allergies     Medication List    TAKE these medications   cholecalciferol 1000 units tablet Commonly known as:  VITAMIN D Take 1,000 Units by mouth daily.   lisinopril 5 MG tablet Commonly known as:  PRINIVIL,ZESTRIL Take 1 tablet (5 mg total) by mouth daily.   metoprolol tartrate 25 MG tablet Commonly known as:  LOPRESSOR Take 1 tablet (25 mg total) by mouth 2 (two) times daily.   multivitamin with minerals Tabs tablet Take 1 tablet by mouth daily.   rivaroxaban 20 MG Tabs tablet Commonly known as:  XARELTO Take 1 tablet (20 mg total) by mouth daily with supper.            Discharge Care Instructions        Start     Ordered   12/29/16 0000  metoprolol tartrate (LOPRESSOR) 25 MG tablet  2 times daily     12/29/16 1315   12/29/16 0000  rivaroxaban (XARELTO) 20 MG TABS tablet  Daily with supper    Question:  Supervising Provider  Answer:  Verne Carrow D   12/29/16 1339   12/29/16 0000  Increase activity slowly     12/29/16 1514   12/29/16 0000  Diet - low sodium heart healthy     12/29/16 1514  12/29/16 0000  lisinopril (PRINIVIL,ZESTRIL) 5 MG tablet  Daily     12/29/16 1514     Follow-up Information    Quintella Reichert, MD Follow up.   Specialty:  Cardiology Why:  The office will call. Contact information: [address] [place] [address] [phone]          No Known Allergies   Procedures/Studies: 2 D ECHO Study Conclusions  - Left ventricle: The cavity size was mildly dilated. Wall   thickness was increased in a pattern of severe LVH. There was   moderate concentric hypertrophy. Systolic function was moderately   to severely reduced. The estimated ejection fraction was in the   range of 30% to 35%. Wall motion was normal; there were no   regional wall motion abnormalities. Doppler parameters are   consistent with restrictive physiology,  indicative of decreased   left ventricular diastolic compliance and/or increased left   atrial pressure. - Aortic valve: Trileaflet; normal thickness leaflets. There was no   regurgitation. - Aortic root: The aortic root was normal in size. - Mitral valve: There was mild regurgitation. - Left atrium: The atrium was severely dilated. - Right ventricle: The cavity size was normal. Wall thickness was   normal. Systolic function was normal. - Right atrium: The atrium was normal in size. - Tricuspid valve: There was mild regurgitation. - Pulmonary arteries: Systolic pressure was moderately increased.   PA peak pressure: 50 mm Hg (S). - Inferior vena cava: The vessel was normal in size. - Pericardium, extracardiac: A trivial pericardial effusion was   identified posterior to the heart. Features were not consistent   with tamponade physiology.   Dg Chest 2 View  Result Date: 12/28/2016 CLINICAL DATA:  Cough EXAM: CHEST  2 VIEW COMPARISON:  None. FINDINGS: Lungs are clear.  No pleural effusion or pneumothorax. Mild cardiomegaly. Mild degenerative changes of the visualized thoracolumbar spine. IMPRESSION: No evidence of acute cardiopulmonary disease. Electronically Signed   By: Charline Bills M.D.   On: 12/28/2016 12:35       Discharge Exam: Vitals:   12/29/16 1320 12/29/16 1417  BP: 129/90   Pulse:  76  Resp:    Temp:  (!) 97.5 F (36.4 C)  SpO2:     Vitals:   12/29/16 0426 12/29/16 0610 12/29/16 1320 12/29/16 1417  BP: (!) 151/123 (!) 151/105 129/90   Pulse:    76  Resp:      Temp:    (!) 97.5 F (36.4 C)  TempSrc:    Axillary  SpO2:      Weight:      Height:        General: Pt is alert, awake, not in acute distress Cardiovascular: RRR, S1/S2 +, no rubs, no gallops Respiratory: CTA bilaterally, no wheezing, no rhonchi Abdominal: Soft, NT, ND, bowel sounds + Extremities: no edema, no cyanosis    The results of significant diagnostics from this hospitalization  (including imaging, microbiology, ancillary and laboratory) are listed below for reference.     Microbiology: No results found for this or any previous visit (from the past 240 hour(s)).   Labs: BNP (last 3 results) No results for input(s): BNP in the last 8760 hours. Basic Metabolic Panel:  Recent Labs Lab 12/28/16 1127 12/28/16 1937 12/29/16 0730  NA 139  --  137  K 3.4*  --  3.6  CL 100*  --  102  CO2 31  --  26  GLUCOSE 105*  --  104*  BUN 15  --  9  CREATININE 1.08  --  1.02  CALCIUM 9.5  --  9.2  MG  --  1.8  --    Liver Function Tests:  Recent Labs Lab 12/28/16 1127  AST 31  ALT 27  ALKPHOS 96  BILITOT 1.8*  PROT 7.7  ALBUMIN 4.4   No results for input(s): LIPASE, AMYLASE in the last 168 hours. No results for input(s): AMMONIA in the last 168 hours. CBC:  Recent Labs Lab 12/28/16 1127  WBC 8.5  NEUTROABS 5.1  HGB 16.7  HCT 48.8  MCV 83.8  PLT 279   Cardiac Enzymes:  Recent Labs Lab 12/28/16 1127 12/28/16 1937 12/29/16 0730  TROPONINI 0.06* 0.07* 0.05*   BNP: Invalid input(s): POCBNP CBG: No results for input(s): GLUCAP in the last 168 hours. D-Dimer No results for input(s): DDIMER in the last 72 hours. Hgb A1c  Recent Labs  12/29/16 1029  HGBA1C 5.5   Lipid Profile  Recent Labs  12/29/16 1029  CHOL 162  HDL 52  LDLCALC 99  TRIG 53  CHOLHDL 3.1   Thyroid function studies  Recent Labs  12/28/16 1937  TSH 1.911   Anemia work up No results for input(s): VITAMINB12, FOLATE, FERRITIN, TIBC, IRON, RETICCTPCT in the last 72 hours. Urinalysis No results found for: COLORURINE, APPEARANCEUR, LABSPEC, PHURINE, GLUCOSEU, HGBUR, BILIRUBINUR, KETONESUR, PROTEINUR, UROBILINOGEN, NITRITE, LEUKOCYTESUR Sepsis Labs Invalid input(s): PROCALCITONIN,  WBC,  LACTICIDVEN Microbiology No results found for this or any previous visit (from the past 240 hour(s)).   Time coordinating discharge: Over 30 minutes  SIGNED:   Calvert Cantor, MD  [facility] 12/30/2016, 2:53 PM Pager   If 7PM-7AM, please contact night-coverage [email or web address] Password [email or web address]

## 2016-12-29 NOTE — Progress Notes (Signed)
ANTICOAGULATION CONSULT NOTE - Initial Consult  Pharmacy Consult for rivaroxaban Indication: atrial fibrillation  No Known Allergies  Patient Measurements: Height: 6\' 1"  (185.4 cm) Weight: 240 lb 8 oz (109.1 kg) IBW/kg (Calculated) : 79.9 Heparin Dosing Weight:   Vital Signs: Temp: 97.7 F (36.5 C) (08/26 0424) BP: 151/105 (08/26 0610) Pulse Rate: 80 (08/26 0424)  Labs:  Recent Labs  12/28/16 1127 12/28/16 1937 12/28/16 2210 12/29/16 0100 12/29/16 0730  HGB 16.7  --   --   --   --   HCT 48.8  --   --   --   --   PLT 279  --   --   --   --   HEPARINUNFRC  --   --  0.39 0.61  --   CREATININE 1.08  --   --   --  1.02  TROPONINI 0.06* 0.07*  --   --  0.05*    Estimated Creatinine Clearance: 99.8 mL/min (by C-G formula based on SCr of 1.02 mg/dL).   Medical History: Past Medical History:  Diagnosis Date  . Atrial fibrillation, persistent (HCC) 12/28/2016  . HTN (hypertension)   . Psoriasis     Assessment: 61 YOM seen at [facility] - seen at urgent care yesterday and sent to ED with new onset AFib and HTN. Pharmacy consulted to dose Xarelto for Afib. Patient may be discharged today pending ECHO.   CBC wnl, no bleeding noted.  Goal of Therapy:  Prevention of thrombosis and stroke  Plan:  Initiate rivaroxaban 20 mg po once daily with food Discontinue heparin gtt Monitor for signs and symptoms of bleeding  Adline Potter, PharmD Pharmacy Resident Pager: [phone] 12/29/2016,1:09 PM

## 2016-12-29 NOTE — Consult Note (Signed)
Cardiology Consultation:   Patient ID: BHAVIN MONJARAZ; [id number]; 1955/12/21   Admit date: 12/28/2016 Date of Consult: 12/29/2016  Primary Care Provider: Patient, No Pcp Per, goes to [facility] prn Primary Cardiologist: New Primary Electrophysiologist:  n/a   Patient Profile:   TAYGEN ACKLIN is a 61 y.o. male with a hx of no recent medical care who is being seen today for the evaluation of atrial fib at the request of Dr Butler Denmark.  History of Present Illness:   Mr. Winkels saw a provider about 18 months ago and was told he has borderline HTN. Hx psoriasis.  He went to [facility] yesterday because he had a cough, no fevers. His mother is starting chemo, he wanted to make sure no infection since he will be caring for her. He was told his BP was very high and he was in afib>>ER for admission.  Pt has no idea when the afib started. He has not had any chest pain. He had some increasing DOE when working in [place], put it down to altitude. He was recently noticing increasing DOE, but it resolved about 3 weeks ago. He started having a generally non-productive cough about 2 weeks ago.  No CP, ever. No orthopnea or PND. No LE edema. Has never gotten light-headed or dizzy.  Pt HR is controlled. He feels fine. His BP has improved with rx, he does not feel any different than he did yesterday.    Past Medical History:  Diagnosis Date  . Atrial fibrillation, persistent (HCC) 12/28/2016  . HTN (hypertension)   . Psoriasis     Past Surgical History:  Procedure Laterality Date  . KNEE ARTHROCENTESIS       Inpatient Medications: Scheduled Meds: . cholecalciferol  1,000 Units Oral Daily  . metoprolol tartrate  25 mg Oral BID  . multivitamin with minerals  1 tablet Oral Daily  . sodium chloride flush  3 mL Intravenous Q12H   Continuous Infusions: . sodium chloride    . heparin 1,500 Units/hr (12/28/16 2350)   PRN Meds: sodium chloride, acetaminophen, ALPRAZolam,  guaiFENesin-dextromethorphan, hydrALAZINE, HYDROcodone-acetaminophen, ondansetron (ZOFRAN) IV, sodium chloride flush, zolpidem Prior to Admission medications   Medication Sig Start Date End Date Taking? Authorizing Provider  cholecalciferol (VITAMIN D) 1000 units tablet Take 1,000 Units by mouth daily.   Yes [provider]  Multiple Vitamin (MULTIVITAMIN WITH MINERALS) TABS tablet Take 1 tablet by mouth daily.   Yes [provider]    Allergies:   No Known Allergies  Social History:   Social History   Social History  . Marital status: Single    Spouse name: N/A  . Number of children: N/A  . Years of education: N/A   Occupational History  . Emergency planning/management officer for a [employer]    Social History Main Topics  . Smoking status: Never Smoker  . Smokeless tobacco: Never Used  . Alcohol use Yes     Comment: 1-2 drinks/week  . Drug use: No  . Sexual activity: Not on file   Other Topics Concern  . Not on file   Social History Narrative   Pt lives alone in [place]    Family History:   The patient's family history includes Cancer in his mother; Diabetes in his mother; GI Disease in his mother; Heart failure in his mother; Obesity in his sister; Other in his father. Pt indicated that his mother is alive. He indicated that his father is deceased. He indicated that his sister is alive.  ROS:  Please see the history of present illness.  All other ROS reviewed and negative.      Physical Exam/Data:   Vitals:   12/29/16 0126 12/29/16 0424 12/29/16 0426 12/29/16 0610  BP: (!) 152/122 (!) 151/123 (!) 151/123 (!) 151/105  Pulse: 84 80    Resp:      Temp:  97.7 F (36.5 C)    TempSrc:      SpO2:  98%    Weight:  240 lb 8 oz (109.1 kg)    Height:        Intake/Output Summary (Last 24 hours) at 12/29/16 1022 Last data filed at 12/29/16 0929  Gross per 24 hour  Intake           484.75 ml  Output             4850 ml  Net         -4365.25 ml    Filed Weights   12/28/16 1120 12/28/16 1845 12/29/16 0424  Weight: 250 lb (113.4 kg) 248 lb 11.2 oz (112.8 kg) 240 lb 8 oz (109.1 kg)   Body mass index is 31.73 kg/m.  General:  Well nourished, well developed, in no acute distress HEENT: normal Lymph: no adenopathy Neck: no JVD Endocrine:  No thryomegaly Vascular: No carotid bruits; FA pulses 2+ bilaterally without bruits  Cardiac:  normal S1, S2; Irreg R&R; no murmur  Lungs:  clear to auscultation bilaterally, no wheezing, rhonchi or rales  Abd: soft, nontender, no hepatomegaly  Ext: no edema Musculoskeletal:  No deformities, BUE and BLE strength normal and equal Skin: warm and dry  Neuro:  CNs 2-12 intact, no focal abnormalities noted Psych:  Normal affect   EKG:  The EKG was personally reviewed and demonstrates:  Atrial fib, HR 111 Telemetry:  Telemetry was personally reviewed and demonstrates:  Atrial fib, controlled  Relevant CV Studies: ECHO: done, pending  Laboratory Data:  Chemistry  Recent Labs Lab 12/28/16 1127 12/29/16 0730  NA 139 137  K 3.4* 3.6  CL 100* 102  CO2 31 26  GLUCOSE 105* 104*  BUN 15 9  CREATININE 1.08 1.02  CALCIUM 9.5 9.2  GFRNONAA >60 >60  GFRAA >60 >60  ANIONGAP 8 9     Recent Labs Lab 12/28/16 1127  PROT 7.7  ALBUMIN 4.4  AST 31  ALT 27  ALKPHOS 96  BILITOT 1.8*   Hematology  Recent Labs Lab 12/28/16 1127  WBC 8.5  RBC 5.82*  HGB 16.7  HCT 48.8  MCV 83.8  MCH 28.7  MCHC 34.2  RDW 14.3  PLT 279   Cardiac Enzymes  Recent Labs Lab 12/28/16 1127 12/28/16 1937 12/29/16 0730  TROPONINI 0.06* 0.07* 0.05*   Radiology/Studies:  Dg Chest 2 View  Result Date: 12/28/2016 CLINICAL DATA:  Cough EXAM: CHEST  2 VIEW COMPARISON:  None. FINDINGS: Lungs are clear.  No pleural effusion or pneumothorax. Mild cardiomegaly. Mild degenerative changes of the visualized thoracolumbar spine. IMPRESSION: No evidence of acute cardiopulmonary disease. Electronically Signed   By:  Charline Bills M.D.   On: 12/28/2016 12:35    Assessment and Plan:   Principal Problem:  Persistent Atrial fibrillation with controlled ventricular rate (HCC) - rate elevated on admission, now controlled on BB - he is on heparin, CHADS2VASC=1 (HTN) - transition to Xarelto - no further inpt workup (TSH Ok, no lifestyle issues) - f/u as outpt, needs sleep study for ?OSA.  Otherwise, per IM Active Problems:   Hypertensive  urgency   Elevated troponin - minimal elevation, no ischemic sx, likely from the afib   Hypokalemia     Signed, Theodore Demark, PA-C  12/29/2016 10:22 AM

## 2016-12-29 NOTE — Progress Notes (Signed)
ANTICOAGULATION CONSULT NOTE - Follow Up Consult  Pharmacy Consult for heparin Indication: chest pain/ACS and atrial fibrillation  Labs:  Recent Labs  12/28/16 1127 12/28/16 1937 12/28/16 2210  HGB 16.7  --   --   HCT 48.8  --   --   PLT 279  --   --   HEPARINUNFRC  --   --  0.39  CREATININE 1.08  --   --   TROPONINI 0.06* 0.07*  --     Assessment/Plan:  61yo male therapeutic on heparin with initial dosing for Afib/CP. Will continue gtt at current rate and confirm stable with am labs.   Vernard Gambles, PharmD, BCPS  12/29/2016,12:01 AM

## 2016-12-30 DIAGNOSIS — I5022 Chronic systolic (congestive) heart failure: Secondary | ICD-10-CM

## 2016-12-30 HISTORY — DX: Chronic systolic (congestive) heart failure: I50.22

## 2017-01-03 ENCOUNTER — Telehealth: Payer: Self-pay | Admitting: *Deleted

## 2017-01-03 NOTE — Telephone Encounter (Signed)
-----   Message from Lana FishLashonda D Shobowale sent at 12/31/2016 10:55 AM EDT ----- Regarding: Sleep Study    ----- Message ----- From: Jacinta ShoeBarrett, Rhonda G, PA-C Sent: 12/29/2016   6:32 PM To: Darrol Jumphonda G Barrett, PA-C, Rober MinionLashonda D Shobowale  HE is a pt of Dr Mayford Knifeurner and needs a sleep study. The messages about a sleep study on Mr Charm BargesButler are an error. Thanks

## 2017-01-03 NOTE — Telephone Encounter (Signed)
Informed patient of upcoming sleep study and patient understanding was verbalized. Patient understands his sleep study is scheduled for Monday February 24 2017. Patient understands his sleep study will be done at [facility] sleep lab. Patient understands he will receive a sleep packet in a week or so. Patient understands to call if he does not receive the sleep packet in a timely manner. Patient agrees with treatment and thanked me for call. 

## 2017-01-27 ENCOUNTER — Encounter: Payer: Self-pay | Admitting: Physician Assistant

## 2017-01-27 ENCOUNTER — Ambulatory Visit (INDEPENDENT_AMBULATORY_CARE_PROVIDER_SITE_OTHER): Payer: Self-pay | Admitting: Physician Assistant

## 2017-01-27 ENCOUNTER — Encounter: Payer: Self-pay | Admitting: *Deleted

## 2017-01-27 ENCOUNTER — Other Ambulatory Visit: Payer: Self-pay

## 2017-01-27 VITALS — BP 164/110 | HR 69 | Ht 73.0 in | Wt 249.4 lb

## 2017-01-27 DIAGNOSIS — I10 Essential (primary) hypertension: Secondary | ICD-10-CM

## 2017-01-27 DIAGNOSIS — I4891 Unspecified atrial fibrillation: Secondary | ICD-10-CM

## 2017-01-27 DIAGNOSIS — I429 Cardiomyopathy, unspecified: Secondary | ICD-10-CM

## 2017-01-27 LAB — BASIC METABOLIC PANEL
BUN / CREAT RATIO: 13 (ref 10–24)
BUN: 15 mg/dL (ref 8–27)
CHLORIDE: 99 mmol/L (ref 96–106)
CO2: 28 mmol/L (ref 20–29)
CREATININE: 1.19 mg/dL (ref 0.76–1.27)
Calcium: 9.6 mg/dL (ref 8.6–10.2)
GFR calc Af Amer: 76 mL/min/{1.73_m2} (ref >59)
GFR calc non Af Amer: 66 mL/min/{1.73_m2} (ref >59)
GLUCOSE: 87 mg/dL (ref 65–99)
POTASSIUM: 3.8 mmol/L (ref 3.5–5.2)
SODIUM: 142 mmol/L (ref 134–144)

## 2017-01-27 MED ORDER — LISINOPRIL 20 MG PO TABS: 20.0000 mg | ORAL_TABLET | ORAL | 3 refills | Status: DC

## 2017-01-27 MED ORDER — RIVAROXABAN 20 MG PO TABS: 20.0000 mg | ORAL_TABLET | Freq: Every day | ORAL | 3 refills | Status: DC

## 2017-01-27 NOTE — Patient Instructions (Signed)
Medication Instructions:  1. INCREASE LISINOPRIL WITH THE DIRECTIONS TO TAKE 10 MG TODAY 9/24 AND TOMORROW 9/25; BEGINNING WED 01/29/17 YOU WILL INCREASE TO 20 MG DAILY; A NEW RX WAS SENT IN FOR THE 20 MG TABLET   Labwork: TODAY BMET  Testing/Procedures: Your physician has recommended that you have a Cardioversion (DCCV). Electrical Cardioversion uses a jolt of electricity to your heart either through paddles or wired patches attached to your chest. This is a controlled, usually prescheduled, procedure. Defibrillation is done under light anesthesia in the hospital, and you usually go home the day of the procedure. This is done to get your heart back into a normal rhythm. You are not awake for the procedure. Please see the instruction sheet given to you today.    Follow-Up: 1. MICHELE LENZE, PAC 2 WEEKS POST CARDIOVERSION WHICH IS SCHEDULED FOR 02/03/17  2. DR. Mayford Knife IN 2 MONTHS   Any Other Special Instructions Will Be Listed Below (If Applicable). If you need a refill on your cardiac medications before your next appointment, please call your pharmacy.   Low-Sodium Eating Plan Sodium, which is an element that makes up salt, helps you maintain a healthy balance of fluids in your body. Too much sodium can increase your blood pressure and cause fluid and waste to be held in your body. Your health care provider or dietitian may recommend following this plan if you have high blood pressure (hypertension), kidney disease, liver disease, or heart failure. Eating less sodium can help lower your blood pressure, reduce swelling, and protect your heart, liver, and kidneys. What are tips for following this plan? General guidelines  Most people on this plan should limit their sodium intake to 1,500-2,000 mg (milligrams) of sodium each day. Reading food labels  The Nutrition Facts label lists the amount of sodium in one serving of the food. If you eat more than one serving, you must multiply the listed  amount of sodium by the number of servings.  Choose foods with less than 140 mg of sodium per serving.  Avoid foods with 300 mg of sodium or more per serving. Shopping  Look for lower-sodium products, often labeled as "low-sodium" or "no salt added."  Always check the sodium content even if foods are labeled as "unsalted" or "no salt added".  Buy fresh foods. ? Avoid canned foods and premade or frozen meals. ? Avoid canned, cured, or processed meats  Buy breads that have less than 80 mg of sodium per slice. Cooking  Eat more home-cooked food and less restaurant, buffet, and fast food.  Avoid adding salt when cooking. Use salt-free seasonings or herbs instead of table salt or sea salt. Check with your health care provider or pharmacist before using salt substitutes.  Cook with plant-based oils, such as canola, sunflower, or olive oil. Meal planning  When eating at a restaurant, ask that your food be prepared with less salt or no salt, if possible.  Avoid foods that contain MSG (monosodium glutamate). MSG is sometimes added to [place] food, bouillon, and some canned foods. What foods are recommended? The items listed may not be a complete list. Talk with your dietitian about what dietary choices are best for you. Grains Low-sodium cereals, including oats, puffed wheat and rice, and shredded wheat. Low-sodium crackers. Unsalted rice. Unsalted pasta. Low-sodium bread. Whole-grain breads and whole-grain pasta. Vegetables Fresh or frozen vegetables. "No salt added" canned vegetables. "No salt added" tomato sauce and paste. Low-sodium or reduced-sodium tomato and vegetable juice. Fruits Fresh, frozen,  or canned fruit. Fruit juice. Meats and other protein foods Fresh or frozen (no salt added) meat, poultry, seafood, and fish. Low-sodium canned tuna and salmon. Unsalted nuts. Dried peas, beans, and lentils without added salt. Unsalted canned beans. Eggs. Unsalted nut  butters. Dairy Milk. Soy milk. Cheese that is naturally low in sodium, such as ricotta cheese, fresh mozzarella, or Swiss cheese Low-sodium or reduced-sodium cheese. Cream cheese. Yogurt. Fats and oils Unsalted butter. Unsalted margarine with no trans fat. Vegetable oils such as canola or olive oils. Seasonings and other foods Fresh and dried herbs and spices. Salt-free seasonings. Low-sodium mustard and ketchup. Sodium-free salad dressing. Sodium-free light mayonnaise. Fresh or refrigerated horseradish. Lemon juice. Vinegar. Homemade, reduced-sodium, or low-sodium soups. Unsalted popcorn and pretzels. Low-salt or salt-free chips. What foods are not recommended? The items listed may not be a complete list. Talk with your dietitian about what dietary choices are best for you. Grains Instant hot cereals. Bread stuffing, pancake, and biscuit mixes. Croutons. Seasoned rice or pasta mixes. Noodle soup cups. Boxed or frozen macaroni and cheese. Regular salted crackers. Self-rising flour. Vegetables Sauerkraut, pickled vegetables, and relishes. Olives. [place] fries. Onion rings. Regular canned vegetables (not low-sodium or reduced-sodium). Regular canned tomato sauce and paste (not low-sodium or reduced-sodium). Regular tomato and vegetable juice (not low-sodium or reduced-sodium). Frozen vegetables in sauces. Meats and other protein foods Meat or fish that is salted, canned, smoked, spiced, or pickled. Bacon, ham, sausage, hotdogs, corned beef, chipped beef, packaged lunch meats, salt pork, jerky, pickled herring, anchovies, regular canned tuna, sardines, salted nuts. Dairy Processed cheese and cheese spreads. Cheese curds. Blue cheese. Feta cheese. String cheese. Regular cottage cheese. Buttermilk. Canned milk. Fats and oils Salted butter. Regular margarine. Ghee. Bacon fat. Seasonings and other foods Onion salt, garlic salt, seasoned salt, table salt, and sea salt. Canned and packaged gravies.  Worcestershire sauce. Tartar sauce. Barbecue sauce. Teriyaki sauce. Soy sauce, including reduced-sodium. Steak sauce. Fish sauce. Oyster sauce. Cocktail sauce. Horseradish that you find on the shelf. Regular ketchup and mustard. Meat flavorings and tenderizers. Bouillon cubes. Hot sauce and Tabasco sauce. Premade or packaged marinades. Premade or packaged taco seasonings. Relishes. Regular salad dressings. Salsa. Potato and tortilla chips. Corn chips and puffs. Salted popcorn and pretzels. Canned or dried soups. Pizza. Frozen entrees and pot pies. Summary  Eating less sodium can help lower your blood pressure, reduce swelling, and protect your heart, liver, and kidneys.  Most people on this plan should limit their sodium intake to 1,500-2,000 mg (milligrams) of sodium each day.  Canned, boxed, and frozen foods are high in sodium. Restaurant foods, fast foods, and pizza are also very high in sodium. You also get sodium by adding salt to food.  Try to cook at home, eat more fresh fruits and vegetables, and eat less fast food, canned, processed, or prepared foods. This information is not intended to replace advice given to you by your health care provider. Make sure you discuss any questions you have with your health care provider. Document Released: 10/12/2001 Document Revised: 04/15/2016 Document Reviewed: 04/15/2016 Elsevier Interactive Patient Education  2017 [employer].

## 2017-01-27 NOTE — Progress Notes (Signed)
Cardiology Office Note    Date:  01/27/2017   ID:  Cory [name], DOB August 11, 1955, MRN [id number]  PCP:  Patient, No Pcp Per  Cardiologist: Dr. Mayford Knife  Chief Complaint  Patient presents with  . Follow-up    History of Present Illness:  Cory [name] is a 61 y.o. male with no prior cardiac history, history of hypertension went to urgent care for upper respiratory infection and was found to be in atrial fibrillation and was sent to the ER, also had hypertensive urgency. He said dyspnea on exertion for about 3 weeks.CHADSVASC=2 HTN & CHF. 2-D echo showed mildly dilated LV with severe LVH, moderate concentric hypertrophy and moderately to severe reduced LV function EF 30-35%. Left atrium was severely dilated and mild MR. He was discharged home on Xarelto and beta blocker with plans for cardioversion in 3 weeks if still in atrial fibrillation.  Patient comes in today for follow-up. Overall he has been feeling well without shortness of breath. He can't feel it is in atrial fibrillation. His blood pressure is quite high today. He hasn't been taking at home as he hasn't gotten a cuff yet. He works as an Art gallery manager with a [employer] walks about 5000-8000 steps daily without symptoms. Denies chest pain, palpitations, dyspnea, dyspnea on exertion, dizziness or presyncope.    Past Medical History:  Diagnosis Date  . Atrial fibrillation with controlled ventricular rate (HCC) 12/28/2016  . Atrial fibrillation, persistent (HCC) 12/28/2016  . Chronic systolic CHF (congestive heart failure) (HCC) 12/30/2016  . Elevated troponin 12/28/2016  . HTN (hypertension)   . Hypertensive urgency 12/28/2016  . Hypokalemia 12/28/2016  . Psoriasis     Past Surgical History:  Procedure Laterality Date  . KNEE ARTHROCENTESIS      Current Medications: Current Meds  Medication Sig  . cholecalciferol (VITAMIN D) 1000 units tablet Take 1,000 Units by mouth daily.  Marland Kitchen lisinopril (PRINIVIL,ZESTRIL) 5 MG  tablet Take 1 tablet (5 mg total) by mouth daily.  . metoprolol tartrate (LOPRESSOR) 25 MG tablet Take 1 tablet (25 mg total) by mouth 2 (two) times daily.  . Multiple Vitamin (MULTIVITAMIN WITH MINERALS) TABS tablet Take 1 tablet by mouth daily.  . rivaroxaban (XARELTO) 20 MG TABS tablet Take 1 tablet (20 mg total) by mouth daily with supper.     Allergies:   Patient has no known allergies.   Social History   Social History  . Marital status: Single    Spouse name: N/A  . Number of children: N/A  . Years of education: N/A   Occupational History  . Emergency planning/management officer for a [employer]    Social History Main Topics  . Smoking status: Never Smoker  . Smokeless tobacco: Never Used  . Alcohol use Yes     Comment: 1-2 drinks/week  . Drug use: No  . Sexual activity: Not Asked   Other Topics Concern  . None   Social History Narrative   Pt lives alone in [place]     Family History:  The patient's family history includes Cancer in his mother; Diabetes in his mother; GI Disease in his mother; Heart failure in his mother; Obesity in his sister; Other in his father.   ROS:   Please see the history of present illness.    Review of Systems  Constitution: Negative.  HENT: Negative.   Cardiovascular: Negative.   Respiratory: Negative.   Endocrine: Negative.   Hematologic/Lymphatic: Negative.   Musculoskeletal: Negative.   Gastrointestinal: Negative.  Genitourinary: Negative.   Neurological: Negative.    All other systems reviewed and are negative.   PHYSICAL EXAM:   VS:  BP (!) 164/110   Pulse 69   Ht  (1.854 m)   Wt 249 lb 6.4 oz (113.1 kg)   BMI 32.90 kg/m   Physical Exam  GEN: Well nourished, well developed, in no acute distress  Neck: no JVD, carotid bruits, or masses Cardiac:RRR; no murmurs, rubs, or gallops  Respiratory:  clear to auscultation bilaterally, normal work of breathing GI: soft, nontender, nondistended, + BS Ext: without cyanosis,  clubbing, or edema, Good distal pulses bilaterally Neuro:  Alert and Oriented x 3 Psych: euthymic mood, full affect  Wt Readings from Last 3 Encounters:  01/27/17 249 lb 6.4 oz (113.1 kg)  12/29/16 240 lb 8 oz (109.1 kg)      Studies/Labs Reviewed:   EKG:  EKG is  ordered today.  The ekg ordered today demonstrates Atrial fibrillation at 69 bpm  Recent Labs: 12/28/2016: ALT 27; Hemoglobin 16.7; Magnesium 1.8; Platelets 279; TSH 1.911 12/29/2016: BUN 9; Creatinine, Ser 1.02; Potassium 3.6; Sodium 137   Lipid Panel    Component Value Date/Time   CHOL 162 12/29/2016 1029   TRIG 53 12/29/2016 1029   HDL 52 12/29/2016 1029   CHOLHDL 3.1 12/29/2016 1029   VLDL 11 12/29/2016 1029   LDLCALC 99 12/29/2016 1029    Additional studies/ records that were reviewed today include:  2-D echo 12/29/16  ECHO: 12/29/2016 - Left ventricle: The cavity size was mildly dilated. Wall   thickness was increased in a pattern of severe LVH. There was   moderate concentric hypertrophy. Systolic function was moderately   to severely reduced. The estimated ejection fraction was in the   range of 30% to 35%. Wall motion was normal; there were no   regional wall motion abnormalities. Doppler parameters are   consistent with restrictive physiology, indicative of decreased   left ventricular diastolic compliance and/or increased left   atrial pressure. - Aortic valve: Trileaflet; normal thickness leaflets. There was no   regurgitation. - Aortic root: The aortic root was normal in size. - Mitral valve: There was mild regurgitation. - Left atrium: The atrium was severely dilated. - Right ventricle: The cavity size was normal. Wall thickness was   normal. Systolic function was normal. - Right atrium: The atrium was normal in size. - Tricuspid valve: There was mild regurgitation. - Pulmonary arteries: Systolic pressure was moderately increased.   PA peak pressure: 50 mm Hg (S). - Inferior vena cava: The  vessel was normal in size. - Pericardium, extracardiac: A trivial pericardial effusion was   identified posterior to the heart. Features were not consistent   with tamponade physiology.     ASSESSMENT:    1. Atrial fibrillation with controlled ventricular rate (HCC)   2. Essential hypertension   3. Cardiomyopathy, unspecified type (HCC)      PLAN:  In order of problems listed above:  Atrial fibrillation question duration. Started on Xarelto and beta blocker 12/28/16 with plans for cardioversion if still in Afib. CHADSVASC at least 2 for HTN and chronic CHF with EF 30-35%. Patient still in atrial fibrillation and is agreeable to cardioversion. He has not missed any Xarelto doses but has 2 days left and cannot afford it. We'll try to give him samples and arrange reduced payment. F/U with me 2 weeks after cardioversion and Dr.Turner in 2 months.  Essential hypertension patient had hypertensive urgency in  the hospital. Blood pressure is quite high today at 164/110. Will increase lisinopril to 10 mg for 2 days then 20 mg daily. Check BMET today. 2 gram sodium diet discussed.  Cardiomyopathy ejection fraction 30-35% without wall motion abnormality. Question secondary to A. fib with RVR or hypertensive. We'll repeat 2-D echo after cardioversion.    Medication Adjustments/Labs and Tests Ordered: Current medicines are reviewed at length with the patient today.  Concerns regarding medicines are outlined above.  Medication changes, Labs and Tests ordered today are listed in the Patient Instructions below. There are no Patient Instructions on file for this visit.   Elson Clan, PA-C  01/27/2017 9:44 AM    [facility] [address] [place], [place], [place]  [address] Phone: [phone]; Fax: [phone]

## 2017-01-28 ENCOUNTER — Telehealth: Payer: Self-pay

## 2017-01-28 NOTE — Telephone Encounter (Signed)
**Note De-Identified [place] Obfuscation** The pt was given a [place] pt assistance application at his OV with Jacolyn Reedy, PA-c yesterday 9/24. Dr Mayford Knife has signed both the pt assistance application and RX. I have placed in yellow folder in the PA Dept. awaiting the pts part of the application.

## 2017-01-29 ENCOUNTER — Other Ambulatory Visit: Payer: Self-pay | Admitting: *Deleted

## 2017-01-29 MED ORDER — METOPROLOL TARTRATE 25 MG PO TABS: 25.0000 mg | ORAL_TABLET | Freq: Two times a day (BID) | ORAL | 3 refills | Status: DC

## 2017-02-03 ENCOUNTER — Encounter (HOSPITAL_COMMUNITY)
Admission: RE | Disposition: A | Discharge status: Home / Self Care | Payer: Self-pay | Source: Ambulatory Visit | Attending: Internal Medicine

## 2017-02-03 ENCOUNTER — Ambulatory Visit (HOSPITAL_COMMUNITY): Payer: Self-pay | Admitting: Anesthesiology

## 2017-02-03 ENCOUNTER — Encounter (HOSPITAL_COMMUNITY): Payer: Self-pay | Admitting: *Deleted

## 2017-02-03 ENCOUNTER — Ambulatory Visit (HOSPITAL_COMMUNITY)
Admission: RE | Admit: 2017-02-03 | Discharge: 2017-02-03 | Disposition: A | Discharge status: Home / Self Care | Payer: Self-pay | Source: Ambulatory Visit | Attending: Internal Medicine | Admitting: Internal Medicine

## 2017-02-03 DIAGNOSIS — L409 Psoriasis, unspecified: Secondary | ICD-10-CM | POA: Insufficient documentation

## 2017-02-03 DIAGNOSIS — E669 Obesity, unspecified: Secondary | ICD-10-CM | POA: Insufficient documentation

## 2017-02-03 DIAGNOSIS — I110 Hypertensive heart disease with heart failure: Secondary | ICD-10-CM | POA: Insufficient documentation

## 2017-02-03 DIAGNOSIS — Z7901 Long term (current) use of anticoagulants: Secondary | ICD-10-CM | POA: Insufficient documentation

## 2017-02-03 DIAGNOSIS — I481 Persistent atrial fibrillation: Secondary | ICD-10-CM | POA: Insufficient documentation

## 2017-02-03 DIAGNOSIS — Z6831 Body mass index (BMI) 31.0-31.9, adult: Secondary | ICD-10-CM | POA: Insufficient documentation

## 2017-02-03 DIAGNOSIS — I429 Cardiomyopathy, unspecified: Secondary | ICD-10-CM | POA: Insufficient documentation

## 2017-02-03 DIAGNOSIS — I4891 Unspecified atrial fibrillation: Secondary | ICD-10-CM

## 2017-02-03 DIAGNOSIS — I5022 Chronic systolic (congestive) heart failure: Secondary | ICD-10-CM | POA: Insufficient documentation

## 2017-02-03 HISTORY — PX: CARDIOVERSION: SHX1299

## 2017-02-03 SURGERY — CARDIOVERSION
Anesthesia: General

## 2017-02-03 MED ORDER — SODIUM CHLORIDE 0.9 % IV SOLN: 250.0000 mL | INTRAVENOUS | Status: DC

## 2017-02-03 MED ORDER — SODIUM CHLORIDE 0.9 % IV SOLN
250.0000 mL | INTRAVENOUS | Status: DC
  Administered 2017-02-03: 09:00:00 via INTRAVENOUS
  Administered 2017-02-03: 250 mL via INTRAVENOUS

## 2017-02-03 MED ORDER — SODIUM CHLORIDE 0.9% FLUSH: 3.0000 mL | INTRAVENOUS | Status: DC | PRN

## 2017-02-03 MED ORDER — HYDROCORTISONE 1 % EX CREA
1.0000 "application " | TOPICAL_CREAM | Freq: Three times a day (TID) | CUTANEOUS | Status: DC | PRN
  Filled 2017-02-03: qty 28

## 2017-02-03 MED ORDER — LIDOCAINE HCL (CARDIAC) 20 MG/ML IV SOLN
INTRAVENOUS | Status: DC | PRN
  Administered 2017-02-03: 60 mg via INTRAVENOUS

## 2017-02-03 MED ORDER — SODIUM CHLORIDE 0.9% FLUSH: 3.0000 mL | Freq: Two times a day (BID) | INTRAVENOUS | Status: DC

## 2017-02-03 MED ORDER — PROPOFOL 10 MG/ML IV BOLUS
INTRAVENOUS | Status: DC | PRN
  Administered 2017-02-03: 100 mg via INTRAVENOUS

## 2017-02-03 NOTE — Anesthesia Postprocedure Evaluation (Signed)
Anesthesia Post Note  Patient: Cory [name]  Procedure(s) Performed: CARDIOVERSION (N/A )     Patient location during evaluation: Endoscopy Anesthesia Type: General Level of consciousness: awake Pain management: pain level controlled Vital Signs Assessment: post-procedure vital signs reviewed and stable Respiratory status: spontaneous breathing Cardiovascular status: stable Postop Assessment: no apparent nausea or vomiting Anesthetic complications: no    Last Vitals:  Vitals:   02/03/17 0930 02/03/17 0940  BP: 117/81 124/82  Pulse: (!) 55 (!) 57  Resp: 18 15  Temp:    SpO2: 98% 99%    Last Pain:  Vitals:   02/03/17 0920  TempSrc: Oral   Pain Goal:                 Cory [name],Cory [name]

## 2017-02-03 NOTE — Anesthesia Preprocedure Evaluation (Signed)
Anesthesia Evaluation  Patient identified by MRN, date of birth, ID band Patient awake    Reviewed: Allergy & Precautions, NPO status , Patient's Chart, lab work & pertinent test results  Airway Mallampati: I       Dental no notable dental hx. (+) Teeth Intact   Pulmonary neg pulmonary ROS,    Pulmonary exam normal breath sounds clear to auscultation       Cardiovascular hypertension, Pt. on medications and Pt. on home beta blockers  Rhythm:Irregular Rate:Tachycardia     Neuro/Psych negative neurological ROS  negative psych ROS   GI/Hepatic negative GI ROS, Neg liver ROS,   Endo/Other  negative endocrine ROS  Renal/GU negative Renal ROS  negative genitourinary   Musculoskeletal negative musculoskeletal ROS (+)   Abdominal (+) + obese,   Peds  Hematology negative hematology ROS (+)   Anesthesia Other Findings Cory [name]  ECHO COMPLETE WO IMAGING ENHANCING AGENT  Order# [id number]  Reading physician: Lars Masson, MD Ordering physician: Briscoe Deutscher, MD Study date: 12/29/16 Study Result   Result status: Final result                             *[place]*                   *[facility]*                         [address]                        [place], [place] [address]                            [phone]  ------------------------------------------------------------------- Transthoracic Echocardiography  Patient:    Cory [name], Cory [name] MR #:       [id number] Study Date: 12/29/2016 Gender:     M Age:        61 Height:     185.4 cm Weight:     109.1 kg BSA:        2.4 m^2 Pt. Status: Room:       3W16C   ATTENDING    Calvert Cantor [id number]  PERFORMING   [facility], Inpatient  ADMITTING    Opyd, Lavone Neri  ORDERING     Opyd, Timothy S  REFERRING    Opyd, Timothy S  SONOGRAPHER  Dance,  Tiffany  cc:  ------------------------------------------------------------------- LV EF: 30% -   35%  ------------------------------------------------------------------- Indications:      Atrial fibrillation - 427.31.  ------------------------------------------------------------------- Study Conclusions  - Left ventricle: The cavity size was mildly dilated. Wall   thickness was increased in a pattern of severe LVH. There was   moderate concentric hypertrophy. Systolic function was moderately   to severely reduced. The estimated ejection fraction was in the   range of 30% to 35%. Wall motion was normal; there were no   regional wall motion abnormalities. Doppler parameters are   consistent with restrictive physiology, indicative of decreased   left ventricular diastolic compliance and/or increased left   atrial pressure. - Aortic valve: Trileaflet; normal thickness leaflets. There was no   regurgitation. - Aortic root: The aortic root was normal in size. - Mitral valve: There was mild regurgitation. - Left atrium: The atrium was severely dilated. - Right ventricle: The cavity  size was normal. Wall thickness was   normal. Systolic function was normal. - Right atrium: The atrium was normal in size. - Tricuspid valve: There was mild regurgitation. - Pulmonary arteries: Systolic pressure was moderately increased.   PA peak pressure: 50 mm Hg (S). - Inferior vena cava: The vessel was normal in size. - Pericardium, extracardiac: A trivial pericardial effusion was   identified posterior to the heart. Features were not consistent   with tamponade physiology.  ------------------------------------------------------------------- Study data:  No prior study was available for comparison.  Study status:  Routine.  Procedure:  The patient reported no pain pre or post test. Transthoracic echocardiography. Image quality was adequate.  Study completion:  There were no  complications. Transthoracic echocardiography.  M-mode, complete 2D, spectral Doppler, and color Doppler.  Birthdate:  Patient birthdate: 07/14/1955.  Age:  Patient is 61 yr old.  Sex:  Gender: male. BMI: 31.7 kg/m^2.  Blood pressure:     151/105  Patient status: Inpatient.  Study date:  Study date: 12/29/2016. Study time: 09:36 AM.  Location:  Bedside.  -------------------------------------------------------------------  ------------------------------------------------------------------- Left ventricle:  The cavity size was mildly dilated. Wall thickness was increased in a pattern of severe LVH. There was moderate concentric hypertrophy. Systolic function was moderately to severely reduced. The estimated ejection fraction was in the range of 30% to 35%. Wall motion was normal; there were no regional wall motion abnormalities. Doppler parameters are consistent with restrictive physiology, indicative of decreased left ventricular diastolic compliance and/or increased left atrial pressure.  ------------------------------------------------------------------- Aortic valve:   Trileaflet; normal thickness leaflets. Mobility was not restricted.  Doppler:  Transvalvular velocity was within the normal range. There was no stenosis. There was no regurgitation.   ------------------------------------------------------------------- Aorta:  Aortic root: The aortic root was normal in size.  ------------------------------------------------------------------- Mitral valve:   Structurally normal valve.   Mobility was not restricted.  Doppler:  Transvalvular velocity was within the normal range. There was no evidence for stenosis. There was mild regurgitation.    Peak gradient (D): 6 mm Hg.  ------------------------------------------------------------------- Left atrium:  The atrium was severely dilated.  ------------------------------------------------------------------- Right ventricle:   The cavity size was normal. Wall thickness was normal. Systolic function was normal.  ------------------------------------------------------------------- Pulmonic valve:    Structurally normal valve.   Cusp separation was normal.  Doppler:  Transvalvular velocity was within the normal range. There was no evidence for stenosis. There was no regurgitation.  ------------------------------------------------------------------- Tricuspid valve:   Structurally normal valve.    Doppler: Transvalvular velocity was within the normal range. There was mild regurgitation.  ------------------------------------------------------------------- Pulmonary artery:   The main pulmonary artery was normal-sized. Systolic pressure was moderately increased.  ------------------------------------------------------------------- Right atrium:  The atrium was normal in size.  ------------------------------------------------------------------- Pericardium:  A trivial pericardial effusion was identified posterior to the heart.  Doppler:  Features were not consistent with tamponade physiology.  ------------------------------------------------------------------- Systemic veins: Inferior vena cava: The vessel was normal in size.  ------------------------------------------------------------------- Measurements   Left ventricle                          Value        Reference  LV ID, ED, PLAX chordal         (H)     56.2  mm     43 - 52  LV ID, ES, PLAX chordal         (H)     44.2  mm  23 - 38  LV fx shortening, PLAX chordal  (L)     21    %      >=29  LV PW thickness, ED                     17.5  mm     ----------  IVS/LV PW ratio, ED                     0.87         <=1.3    Ventricular septum                      Value        Reference  IVS thickness, ED                       15.3  mm     ----------    LVOT                                    Value        Reference  LVOT ID, S                               22    mm     ----------  LVOT area                               3.8   cm^2   ----------    Aorta                                   Value        Reference  Aortic root ID, ED                      35    mm     ----------  Ascending aorta ID, A-P, S              38    mm     ----------    Left atrium                             Value        Reference  LA ID, A-P, ES                          61    mm     ----------  LA ID/bsa, A-P                  (H)     2.54  cm/m^2 <=2.2  LA volume, S                            150   ml     ----------  LA volume/bsa, S                        62.5  ml/m^2 ----------  LA volume, ES, 1-p A4C  156   ml     ----------  LA volume/bsa, ES, 1-p A4C              65    ml/m^2 ----------  LA volume, ES, 1-p A2C                  134   ml     ----------  LA volume/bsa, ES, 1-p A2C              55.9  ml/m^2 ----------    Mitral valve                            Value        Reference  Mitral E-wave peak velocity             123   cm/s   ----------  Mitral A-wave peak velocity             36.9  cm/s   ----------  Mitral deceleration time                162   ms     150 - 230  Mitral peak gradient, D                 6     mm Hg  ----------  Mitral E/A ratio, peak                  3.3          ----------  Mitral regurg VTI, PISA                 174   cm     ----------  Mitral ERO, PISA                        0.11  cm^2   ----------  Mitral regurg volume, PISA              19    ml     ----------    Pulmonary arteries                      Value        Reference  PA pressure, S, DP              (H)     50    mm Hg  <=30    Tricuspid valve                         Value        Reference  Tricuspid regurg peak velocity          297   cm/s   ----------  Tricuspid peak RV-RA gradient           35    mm Hg  ----------    Right atrium                            Value        Reference  RA ID, S-I, ES, A4C             (H)     72.2  mm     34 - 49  RA  area, ES, A4C                (  H)     31.3  cm^2   8.3 - 19.5  RA volume, ES, A/L                      111   ml     ----------  RA volume/bsa, ES, A/L                  46.3  ml/m^2 ----------    Systemic veins                          Value        Reference  Estimated CVP                           15    mm Hg  ----------    Right ventricle                         Value        Reference  RV ID, minor axis, ED, A4C base         48.3  mm     ----------  RV ID, minor axis, ED, A4C mid          25.9  mm     ----------  RV ID, major axis, ED, A4C      (H)     93    mm     55 - 91  TAPSE                                   22.6  mm     ----------  RV pressure, S, DP              (H)     50    mm Hg  <=30  Legend: (L)  and  (H)  mark values outside specified reference range.  ------------------------------------------------------------------- Prepared and Electronically Authenticated by  Tobias Alexander, M.D. 2018-08-26T13:14:38 [facility] Images   Show images for ECHOCARDIOGRAM COMPLETE Patient Information   Patient Name Cory [name], Cory [name] Sex Male DOB 06-03-1955 SSN [id number] Reason For Exam  Priority: Anticipated Discharge  Not on file Surgical History   Surgical History    No past medical history on file.  Other Surgical History    Procedure Laterality Date Comment Source KNEE ARTHROCENTESIS    Provider  Patient Data   Height  73 in  BP  151/105 mmHg    Performing Technologist/Nurse   Performing Technologist/Nurse: Dance, Tiffany G  2D Measurements    LV PW d  17.5 mm (A) (Range: 0.6 - 1.1)  FS  21 % (A) (Range: 28 - 44)  LA ID, A-P, ES  61 mm  LA diam end sys  61 mm  LA vol  150 mL  LA vol index  62.5 mL/m2  IVS/LV PW RATIO, ED  .87   FS  21 % (A) (Range: 28 - 44)    Right Ventricle Measurements    TAPSE  22.6 mm  RV sys press  50 mmHg    Aortic Valve Measurements    Stenosis LVOT diameter  22 mm  LVOT area  3.8  cm2       Aortic Root Measurements - End Diastolic    Ao-asc  38 cm    Left  Atrium Measurements    LA ID, A-P, ES  61 mm  LA vol  150 mL  LA vol index  62.5 mL/m2  LA vol A4C  156 ml    Mitral Valve Measurements    Stenosis Peak grad  6 mmHg  MV pk A vel  36.9 m/s    Regurgitation VTI  174 cm  MV Dec  162   E decel time  162 msec    PISA-MS/PISA-MR MV pk E vel  123 m/s  PISA EROA  0.11 cm2     Tricuspid Valve Measurements    PISA TR max vel  297 cm/s        Implants     No active implants to display in this view. Order-Level Documents - 12/28/2016:   Scan on 12/30/2016 1:21 PM by Default, Provider, MDScan on 12/30/2016 1:21 PM by Default, Provider, MD    Encounter-Level Documents - 12/28/2016:   Document on 01/01/2017 6:33 PM by Tomasa Hosteller, RN : ED PB Summary  Scan on 12/30/2016 2:01 PM by Default, Provider, MDScan on 12/30/2016 2:01 PM by Default, Provider, MD  Document on 12/29/2016 3:45 PM by Jeronimo Norma, RN : IP After Visit Summary  Scan on 12/29/2016 11:47 AM by Default, Provider, MDScan on 12/29/2016 11:47 AM by Default, Provider, MD  Scan on 12/29/2016 9:04 AM by Default, Provider, MDScan on 12/29/2016 9:04 AM by Default, Provider, MD  Electronic signature on 12/28/2016 5:12 PM  Electronic signature on 12/28/2016 4:36 PM    Signed   Electronically signed by Lars Masson, MD on 12/29/16 at 1315 EDT Printable Result Report    Result Report  External Result Report    External Result Report     Reproductive/Obstetrics                             Anesthesia Physical Anesthesia Plan  ASA: III  Anesthesia Plan: General   Post-op Pain Management:    Induction: Intravenous  PONV Risk Score and Plan: 2 and Ondansetron and Dexamethasone  Airway Management Planned: Simple Face Mask and Natural Airway  Additional Equipment:   Intra-op Plan:   Post-operative Plan:    Informed Consent: I have reviewed the patients History and Physical, chart, labs and discussed the procedure including the risks, benefits and alternatives for the proposed anesthesia with the patient or authorized representative who has indicated his/her understanding and acceptance.     Plan Discussed with: CRNA and Surgeon  Anesthesia Plan Comments:         Anesthesia Quick Evaluation

## 2017-02-03 NOTE — Transfer of Care (Signed)
Immediate Anesthesia Transfer of Care Note  Patient: Ailene Rud  Procedure(s) Performed: CARDIOVERSION (N/A )  Patient Location: PACU  Anesthesia Type:General  Level of Consciousness: awake, alert , oriented and patient cooperative  Airway & Oxygen Therapy: Patient Spontanous Breathing and Patient connected to nasal cannula oxygen  Post-op Assessment: Report given to RN and Post -op Vital signs reviewed and stable  Post vital signs: Reviewed and stable  Last Vitals:  Vitals:   02/03/17 0831 02/03/17 0910  BP: (!) 171/128 (!) 138/93  Pulse: 75 69  Resp: 13 15  Temp: 36.8 C   SpO2: 99% 100%    Last Pain:  Vitals:   02/03/17 0831  TempSrc: Oral         Complications: No apparent anesthesia complications

## 2017-02-03 NOTE — H&P (View-Only) (Signed)
Cardiology Office Note    Date:  01/27/2017   ID:  Cory [name], DOB [birth year], MRN [id number]  PCP:  Patient, No Pcp Per  Cardiologist: Dr. Mayford Knife  Chief Complaint  Patient presents with  . Follow-up    History of Present Illness:  Cory [name] is a 61 y.o. male with no prior cardiac history, history of hypertension went to urgent care for upper respiratory infection and was found to be in atrial fibrillation and was sent to the ER, also had hypertensive urgency. He said dyspnea on exertion for about 3 weeks.CHADSVASC=2 HTN & CHF. 2-D echo showed mildly dilated LV with severe LVH, moderate concentric hypertrophy and moderately to severe reduced LV function EF 30-35%. Left atrium was severely dilated and mild MR. He was discharged home on Xarelto and beta blocker with plans for cardioversion in 3 weeks if still in atrial fibrillation.  Patient comes in today for follow-up. Overall he has been feeling well without shortness of breath. He can't feel it is in atrial fibrillation. His blood pressure is quite high today. He hasn't been taking at home as he hasn't gotten a cuff yet. He works as an Art gallery manager with a [employer] walks about 5000-8000 steps daily without symptoms. Denies chest pain, palpitations, dyspnea, dyspnea on exertion, dizziness or presyncope.    Past Medical History:  Diagnosis Date  . Atrial fibrillation with controlled ventricular rate (HCC) 12/28/2016  . Atrial fibrillation, persistent (HCC) 12/28/2016  . Chronic systolic CHF (congestive heart failure) (HCC) 12/30/2016  . Elevated troponin 12/28/2016  . HTN (hypertension)   . Hypertensive urgency 12/28/2016  . Hypokalemia 12/28/2016  . Psoriasis     Past Surgical History:  Procedure Laterality Date  . KNEE ARTHROCENTESIS      Current Medications: Current Meds  Medication Sig  . cholecalciferol (VITAMIN D) 1000 units tablet Take 1,000 Units by mouth daily.  Marland Kitchen lisinopril (PRINIVIL,ZESTRIL) 5 MG  tablet Take 1 tablet (5 mg total) by mouth daily.  . metoprolol tartrate (LOPRESSOR) 25 MG tablet Take 1 tablet (25 mg total) by mouth 2 (two) times daily.  . Multiple Vitamin (MULTIVITAMIN WITH MINERALS) TABS tablet Take 1 tablet by mouth daily.  . rivaroxaban (XARELTO) 20 MG TABS tablet Take 1 tablet (20 mg total) by mouth daily with supper.     Allergies:   Patient has no known allergies.   Social History   Social History  . Marital status: Single    Spouse name: N/A  . Number of children: N/A  . Years of education: N/A   Occupational History  . Emergency planning/management officer for a [employer]    Social History Main Topics  . Smoking status: Never Smoker  . Smokeless tobacco: Never Used  . Alcohol use Yes     Comment: 1-2 drinks/week  . Drug use: No  . Sexual activity: Not Asked   Other Topics Concern  . None   Social History Narrative   Pt lives alone in [place]     Family History:  The patient's family history includes Cancer in his mother; Diabetes in his mother; GI Disease in his mother; Heart failure in his mother; Obesity in his sister; Other in his father.   ROS:   Please see the history of present illness.    Review of Systems  Constitution: Negative.  HENT: Negative.   Cardiovascular: Negative.   Respiratory: Negative.   Endocrine: Negative.   Hematologic/Lymphatic: Negative.   Musculoskeletal: Negative.   Gastrointestinal: Negative.  Genitourinary: Negative.   Neurological: Negative.    All other systems reviewed and are negative.   PHYSICAL EXAM:   VS:  BP (!) 164/110   Pulse 69   Ht  (1.854 m)   Wt 249 lb 6.4 oz (113.1 kg)   BMI 32.90 kg/m   Physical Exam  GEN: Well nourished, well developed, in no acute distress  Neck: no JVD, carotid bruits, or masses Cardiac:RRR; no murmurs, rubs, or gallops  Respiratory:  clear to auscultation bilaterally, normal work of breathing GI: soft, nontender, nondistended, + BS Ext: without cyanosis,  clubbing, or edema, Good distal pulses bilaterally Neuro:  Alert and Oriented x 3 Psych: euthymic mood, full affect  Wt Readings from Last 3 Encounters:  01/27/17 249 lb 6.4 oz (113.1 kg)  12/29/16 240 lb 8 oz (109.1 kg)      Studies/Labs Reviewed:   EKG:  EKG is  ordered today.  The ekg ordered today demonstrates Atrial fibrillation at 69 bpm  Recent Labs: 12/28/2016: ALT 27; Hemoglobin 16.7; Magnesium 1.8; Platelets 279; TSH 1.911 12/29/2016: BUN 9; Creatinine, Ser 1.02; Potassium 3.6; Sodium 137   Lipid Panel    Component Value Date/Time   CHOL 162 12/29/2016 1029   TRIG 53 12/29/2016 1029   HDL 52 12/29/2016 1029   CHOLHDL 3.1 12/29/2016 1029   VLDL 11 12/29/2016 1029   LDLCALC 99 12/29/2016 1029    Additional studies/ records that were reviewed today include:  2-D echo 12/29/16  ECHO: 12/29/2016 - Left ventricle: The cavity size was mildly dilated. Wall   thickness was increased in a pattern of severe LVH. There was   moderate concentric hypertrophy. Systolic function was moderately   to severely reduced. The estimated ejection fraction was in the   range of 30% to 35%. Wall motion was normal; there were no   regional wall motion abnormalities. Doppler parameters are   consistent with restrictive physiology, indicative of decreased   left ventricular diastolic compliance and/or increased left   atrial pressure. - Aortic valve: Trileaflet; normal thickness leaflets. There was no   regurgitation. - Aortic root: The aortic root was normal in size. - Mitral valve: There was mild regurgitation. - Left atrium: The atrium was severely dilated. - Right ventricle: The cavity size was normal. Wall thickness was   normal. Systolic function was normal. - Right atrium: The atrium was normal in size. - Tricuspid valve: There was mild regurgitation. - Pulmonary arteries: Systolic pressure was moderately increased.   PA peak pressure: 50 mm Hg (S). - Inferior vena cava: The  vessel was normal in size. - Pericardium, extracardiac: A trivial pericardial effusion was   identified posterior to the heart. Features were not consistent   with tamponade physiology.     ASSESSMENT:    1. Atrial fibrillation with controlled ventricular rate (HCC)   2. Essential hypertension   3. Cardiomyopathy, unspecified type (HCC)      PLAN:  In order of problems listed above:  Atrial fibrillation question duration. Started on Xarelto and beta blocker 12/28/16 with plans for cardioversion if still in Afib. CHADSVASC at least 2 for HTN and chronic CHF with EF 30-35%. Patient still in atrial fibrillation and is agreeable to cardioversion. He has not missed any Xarelto doses but has 2 days left and cannot afford it. We'll try to give him samples and arrange reduced payment. F/U with me 2 weeks after cardioversion and Dr.Turner in 2 months.  Essential hypertension patient had hypertensive urgency in  the hospital. Blood pressure is quite high today at 164/110. Will increase lisinopril to 10 mg for 2 days then 20 mg daily. Check BMET today. 2 gram sodium diet discussed.  Cardiomyopathy ejection fraction 30-35% without wall motion abnormality. Question secondary to A. fib with RVR or hypertensive. We'll repeat 2-D echo after cardioversion.    Medication Adjustments/Labs and Tests Ordered: Current medicines are reviewed at length with the patient today.  Concerns regarding medicines are outlined above.  Medication changes, Labs and Tests ordered today are listed in the Patient Instructions below. There are no Patient Instructions on file for this visit.   Elson Clan, PA-C  01/27/2017 9:44 AM    [facility] [address] [place], [place], [place]  [address] Phone: [phone]; Fax: [phone]

## 2017-02-03 NOTE — Anesthesia Procedure Notes (Signed)
Procedure Name: MAC Date/Time: 02/03/2017 9:05 AM Performed by: Izora Gala Pre-anesthesia Checklist: Patient identified Patient Re-evaluated:Patient Re-evaluated prior to induction Oxygen Delivery Method: Ambu bag Preoxygenation: Pre-oxygenation with 100% oxygen Induction Type: IV induction Ventilation: Mask ventilation without difficulty Placement Confirmation: positive ETCO2

## 2017-02-03 NOTE — Discharge Instructions (Signed)
Electrical Cardioversion, Care After °This sheet gives you information about how to care for yourself after your procedure. Your health care provider may also give you more specific instructions. If you have problems or questions, contact your health care provider. °What can I expect after the procedure? °After the procedure, it is common to have: °· Some redness on the skin where the shocks were given. ° °Follow these instructions at home: °· Do not drive for 24 hours if you were given a medicine to help you relax (sedative). °· Take over-the-counter and prescription medicines only as told by your health care provider. °· Ask your health care provider how to check your pulse. Check it often. °· Rest for 48 hours after the procedure or as told by your health care provider. °· Avoid or limit your caffeine use as told by your health care provider. °Contact a health care provider if: °· You feel like your heart is beating too quickly or your pulse is not regular. °· You have a serious muscle cramp that does not go away. °Get help right away if: °· You have discomfort in your chest. °· You are dizzy or you feel faint. °· You have trouble breathing or you are short of breath. °· Your speech is slurred. °· You have trouble moving an arm or leg on one side of your body. °· Your fingers or toes turn cold or blue. °This information is not intended to replace advice given to you by your health care provider. Make sure you discuss any questions you have with your health care provider. °Document Released: 02/10/2013 Document Revised: 11/24/2015 Document Reviewed: 10/27/2015 °Elsevier Interactive Patient Education © 2018 [employer] ° °

## 2017-02-03 NOTE — Interval H&P Note (Signed)
History and Physical Interval Note:  02/03/2017 8:53 AM  Cory [name]  has presented today for surgery, with the diagnosis of AFIB  The various methods of treatment have been discussed with the patient and family. After consideration of risks, benefits and other options for treatment, the patient has consented to  Procedure(s): CARDIOVERSION (N/A) as a surgical intervention .  The patient's history has been reviewed, patient examined, no change in status, stable for surgery.  I have reviewed the patient's chart and labs.  Questions were answered to the patient's satisfaction.     Dietrich Pates

## 2017-02-03 NOTE — Op Note (Signed)
Cardioversion  Pt anesthetized with propofol and lidocaine With pads in AP position, pt cardioverted to SR with 200 J synchronized biphasic energy

## 2017-02-04 ENCOUNTER — Encounter (HOSPITAL_COMMUNITY): Payer: Self-pay | Admitting: Internal Medicine

## 2017-02-18 ENCOUNTER — Encounter: Payer: Self-pay | Admitting: Physician Assistant

## 2017-02-18 ENCOUNTER — Ambulatory Visit (INDEPENDENT_AMBULATORY_CARE_PROVIDER_SITE_OTHER): Payer: Self-pay | Admitting: Physician Assistant

## 2017-02-18 VITALS — BP 136/88 | HR 79 | Resp 16 | Ht 73.0 in | Wt 250.8 lb

## 2017-02-18 DIAGNOSIS — I10 Essential (primary) hypertension: Secondary | ICD-10-CM

## 2017-02-18 DIAGNOSIS — I429 Cardiomyopathy, unspecified: Secondary | ICD-10-CM

## 2017-02-18 DIAGNOSIS — I5022 Chronic systolic (congestive) heart failure: Secondary | ICD-10-CM

## 2017-02-18 DIAGNOSIS — I4891 Unspecified atrial fibrillation: Secondary | ICD-10-CM

## 2017-02-18 NOTE — Progress Notes (Signed)
Cardiology Office Note    Date:  02/18/2017   ID:  Cory [name], DOB 1955/05/28, MRN [id number]  PCP:  Patient, No Pcp Per  Cardiologist: Dr. Mayford Knife  Chief Complaint  Patient presents with  . Atrial Fibrillation    History of Present Illness:  Cory [name] is a 61 y.o. male with no prior cardiac history, history of hypertension went to urgent care for upper respiratory infection and was found to be in atrial fibrillation and was sent to the ER, also had hypertensive urgency. He said dyspnea on exertion for about 3 weeks.CHADSVASC=2 HTN & CHF. 2-D echo showed mildly dilated LV with severe LVH, moderate concentric hypertrophy and moderately to severe reduced LV function EF 30-35%. Left atrium was severely dilated and mild MR. He was discharged home on Xarelto and beta blocker with plans for cardioversion in 3 weeks if still in atrial fibrillation.   I saw the patient 01/27/17 and follow-up and was feeling well. Patient underwent successful cardioversion 02/03/17.  Patient comes in today for follow-up. Unfortunately he is in atrial fibrillation again. He says he felt ready good for a couple days and then similar to before he came in. Overall he is not very symptomatic with this. He is disappointed is back in A. fib. He wants to start an exercise program.    Past Medical History:  Diagnosis Date  . Atrial fibrillation with controlled ventricular rate (HCC) 12/28/2016  . Atrial fibrillation, persistent (HCC) 12/28/2016  . Chronic systolic CHF (congestive heart failure) (HCC) 12/30/2016  . Elevated troponin 12/28/2016  . HTN (hypertension)   . Hypertensive urgency 12/28/2016  . Hypokalemia 12/28/2016  . Psoriasis     Past Surgical History:  Procedure Laterality Date  . CARDIOVERSION N/A 02/03/2017   Procedure: CARDIOVERSION;  Surgeon: Pricilla Riffle, MD;  Location: [facility] ENDOSCOPY;  Service: Cardiovascular;  Laterality: N/A;  . KNEE ARTHROCENTESIS      Current Medications: Current  Meds  Medication Sig  . cholecalciferol (VITAMIN D) 1000 units tablet Take 1,000 Units by mouth daily.  Marland Kitchen lisinopril (PRINIVIL,ZESTRIL) 20 MG tablet Take 1 tablet (20 mg total) by mouth as directed. Take 1/2 tab =10 mg 9/24 & 9/25; then increase to 1 whole tab daily  . metoprolol tartrate (LOPRESSOR) 25 MG tablet Take 1 tablet (25 mg total) by mouth 2 (two) times daily.  . Multiple Vitamin (MULTIVITAMIN WITH MINERALS) TABS tablet Take 1 tablet by mouth daily.  . rivaroxaban (XARELTO) 20 MG TABS tablet Take 1 tablet (20 mg total) by mouth daily with supper.     Allergies:   Patient has no known allergies.   Social History   Social History  . Marital status: Single    Spouse name: N/A  . Number of children: N/A  . Years of education: N/A   Occupational History  . Emergency planning/management officer for a [employer]    Social History Main Topics  . Smoking status: Never Smoker  . Smokeless tobacco: Never Used  . Alcohol use Yes     Comment: 1-2 drinks/week  . Drug use: No  . Sexual activity: Not Asked   Other Topics Concern  . None   Social History Narrative   Pt lives alone in [place]     Family History:  The patient's family history includes Cancer in his mother; Diabetes in his mother; GI Disease in his mother; Heart failure in his mother; Obesity in his sister; Other in his father.   ROS:  Please see the history of present illness.    Review of Systems  Constitution: Negative.  HENT: Negative.   Cardiovascular: Negative.   Respiratory: Negative.   Endocrine: Negative.   Hematologic/Lymphatic: Negative.   Musculoskeletal: Negative.   Gastrointestinal: Negative.   Genitourinary: Negative.   Neurological: Negative.    All other systems reviewed and are negative.   PHYSICAL EXAM:   VS:  BP 136/88   Pulse 79   Resp 16   Ht  (1.854 m)   Wt 250 lb 12.8 oz (113.8 kg)   SpO2 97%   BMI 33.09 kg/m   Physical Exam  GEN: Well nourished, well developed, in no acute  distress  Neck: no JVD, carotid bruits, or masses Cardiac: Irregular irregular; no murmurs, rubs, or gallops  Respiratory:  clear to auscultation bilaterally, normal work of breathing GI: soft, nontender, nondistended, + BS Ext: without cyanosis, clubbing, or edema, Good distal pulses bilaterally Neuro:  Alert and Oriented x 3 Psych: euthymic mood, full affect  Wt Readings from Last 3 Encounters:  02/18/17 250 lb 12.8 oz (113.8 kg)  01/27/17 249 lb 6.4 oz (113.1 kg)  12/29/16 240 lb 8 oz (109.1 kg)      Studies/Labs Reviewed:   EKG:  EKG is  ordered today.  The ekg ordered today demonstrates Atrial fibrillation at 72 bpm  Recent Labs: 12/28/2016: ALT 27; Hemoglobin 16.7; Magnesium 1.8; Platelets 279; TSH 1.911 01/27/2017: BUN 15; Creatinine, Ser 1.19; Potassium 3.8; Sodium 142   Lipid Panel    Component Value Date/Time   CHOL 162 12/29/2016 1029   TRIG 53 12/29/2016 1029   HDL 52 12/29/2016 1029   CHOLHDL 3.1 12/29/2016 1029   VLDL 11 12/29/2016 1029   LDLCALC 99 12/29/2016 1029    Additional studies/ records that were reviewed today include:   ECHO: 12/29/2016 - Left ventricle: The cavity size was mildly dilated. Wall   thickness was increased in a pattern of severe LVH. There was   moderate concentric hypertrophy. Systolic function was moderately   to severely reduced. The estimated ejection fraction was in the   range of 30% to 35%. Wall motion was normal; there were no   regional wall motion abnormalities. Doppler parameters are   consistent with restrictive physiology, indicative of decreased   left ventricular diastolic compliance and/or increased left   atrial pressure. - Aortic valve: Trileaflet; normal thickness leaflets. There was no   regurgitation. - Aortic root: The aortic root was normal in size. - Mitral valve: There was mild regurgitation. - Left atrium: The atrium was severely dilated. - Right ventricle: The cavity size was normal. Wall thickness  was   normal. Systolic function was normal. - Right atrium: The atrium was normal in size. - Tricuspid valve: There was mild regurgitation. - Pulmonary arteries: Systolic pressure was moderately increased.   PA peak pressure: 50 mm Hg (S). - Inferior vena cava: The vessel was normal in size. - Pericardium, extracardiac: A trivial pericardial effusion was   identified posterior to the heart. Features were not consistent   with tamponade physiology.      ASSESSMENT:    1. Atrial fibrillation with controlled ventricular rate (HCC)   2. Essential hypertension   3. Chronic systolic CHF (congestive heart failure) (HCC)   4. Cardiomyopathy, unspecified type (HCC)      PLAN:  In order of problems listed above:  Atrial fibrillation status post successful DCCV 02/03/17 CHADSVASC=2. Patient is back in atrial fibrillation. He's got severely  dilated left atrium and cardiomyopathy EF 35%. Will refer to A. fib clinic for further evaluation and treatment. Appointment scheduled for tomorrow. Spoke with Rudi Coco, NP.  Essential hypertension blood pressure controlled  Chronic systolic CHF compensated  Cardiomyopathy ejection fraction 30-35% on echo 12/2016. We'll repeat echo once that he is in normal sinus rhythm.    Medication Adjustments/Labs and Tests Ordered: Current medicines are reviewed at length with the patient today.  Concerns regarding medicines are outlined above.  Medication changes, Labs and Tests ordered today are listed in the Patient Instructions below. There are no Patient Instructions on file for this visit.   Elson Clan, PA-C  02/18/2017 12:49 PM    [facility] [address] [place], [place], [place]  [address] Phone: [phone]; Fax: [phone]

## 2017-02-18 NOTE — Patient Instructions (Signed)
Your physician recommends that you continue on your current medications as directed. Please refer to the Current Medication list given to you today.  You are scheduled with Rudi Coco, NP in the AFIB Clinic.  Please call [phone] for instructions/directions (due to construction) before you go to the appointment.  The code for the parking garage date is 8000.

## 2017-02-19 ENCOUNTER — Ambulatory Visit (HOSPITAL_COMMUNITY)
Admission: RE | Admit: 2017-02-19 | Discharge: 2017-02-19 | Disposition: A | Discharge status: Home / Self Care | Payer: Self-pay | Source: Ambulatory Visit | Attending: Nurse Practitioner | Admitting: Nurse Practitioner

## 2017-02-19 ENCOUNTER — Encounter (HOSPITAL_COMMUNITY): Payer: Self-pay | Admitting: Nurse Practitioner

## 2017-02-19 VITALS — BP 158/94 | HR 71 | Ht 73.0 in | Wt 252.6 lb

## 2017-02-19 DIAGNOSIS — Z833 Family history of diabetes mellitus: Secondary | ICD-10-CM | POA: Insufficient documentation

## 2017-02-19 DIAGNOSIS — L409 Psoriasis, unspecified: Secondary | ICD-10-CM | POA: Insufficient documentation

## 2017-02-19 DIAGNOSIS — Z7901 Long term (current) use of anticoagulants: Secondary | ICD-10-CM | POA: Insufficient documentation

## 2017-02-19 DIAGNOSIS — Z8379 Family history of other diseases of the digestive system: Secondary | ICD-10-CM | POA: Insufficient documentation

## 2017-02-19 DIAGNOSIS — I481 Persistent atrial fibrillation: Secondary | ICD-10-CM | POA: Insufficient documentation

## 2017-02-19 DIAGNOSIS — Z8249 Family history of ischemic heart disease and other diseases of the circulatory system: Secondary | ICD-10-CM | POA: Insufficient documentation

## 2017-02-19 DIAGNOSIS — I4891 Unspecified atrial fibrillation: Secondary | ICD-10-CM

## 2017-02-19 DIAGNOSIS — Z79899 Other long term (current) drug therapy: Secondary | ICD-10-CM | POA: Insufficient documentation

## 2017-02-19 DIAGNOSIS — I110 Hypertensive heart disease with heart failure: Secondary | ICD-10-CM | POA: Insufficient documentation

## 2017-02-19 DIAGNOSIS — I5022 Chronic systolic (congestive) heart failure: Secondary | ICD-10-CM | POA: Insufficient documentation

## 2017-02-19 MED ORDER — POTASSIUM CHLORIDE ER 10 MEQ PO TBCR: 10.0000 meq | EXTENDED_RELEASE_TABLET | Freq: Every day | ORAL | 3 refills | Status: DC

## 2017-02-19 NOTE — Progress Notes (Signed)
Primary Care Physician: Patient, No Pcp Per Referring Physician: Dr. Lorelle Formosa is a 61 y.o. male with a h/o hypertension went to urgent care for upper respiratory infection and was found to be in atrial fibrillation and was sent to the ER, 12/28/2016, also had hypertensive urgency. He said dyspnea on exertion for about 3 weeks.CHADSVASC=2 HTN & CHF.2-D echo showed mildly dilated LV with severe LVH, moderate concentric hypertrophy and moderately to severe reduced LV function EF 30-35%. Left atrium was severely dilated and mild MR. He was discharged home on Xarelto and beta blocker with plans for cardioversion in 3 weeks if still in atrial fibrillation. He had cardioversion 10/1 but had early return of afib after 3-4 days. He felt better in SR with more energy and less dyspnea. Unfortunately on f/u with Herma Carson, PA, yesterday, he was back in afib.   He is in the afib office to discuss options to restore SR. He is pending sleep study next week. He drinks minimal alcohol, no significant caffeine or tobacco. 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 He is pending getting insurance as of January 1.  Today, he denies symptoms of palpitations, chest pain, shortness of breath, orthopnea, PND, lower extremity edema, dizziness, presyncope, syncope, or neurologic sequela. The patient is tolerating medications without difficulties and is otherwise without complaint today.   Past Medical History:  Diagnosis Date  . Atrial fibrillation with controlled ventricular rate (HCC) 12/28/2016  . Atrial fibrillation, persistent (HCC) 12/28/2016  . Chronic systolic CHF (congestive heart failure) (HCC) 12/30/2016  . Elevated troponin 12/28/2016  . HTN (hypertension)   . Hypertensive urgency 12/28/2016  . Hypokalemia 12/28/2016  . Psoriasis    Past Surgical History:  Procedure Laterality Date  . CARDIOVERSION N/A 02/03/2017   Procedure: CARDIOVERSION;   Surgeon: Pricilla Riffle, MD;  Location: [facility] ENDOSCOPY;  Service: Cardiovascular;  Laterality: N/A;  . KNEE ARTHROCENTESIS      Current Outpatient Prescriptions  Medication Sig Dispense Refill  . cholecalciferol (VITAMIN D) 1000 units tablet Take 1,000 Units by mouth daily.    Marland Kitchen lisinopril (PRINIVIL,ZESTRIL) 20 MG tablet Take 20 mg by mouth daily.    . metoprolol tartrate (LOPRESSOR) 25 MG tablet Take 1 tablet (25 mg total) by mouth 2 (two) times daily. 180 tablet 3  . Multiple Vitamin (MULTIVITAMIN WITH MINERALS) TABS tablet Take 1 tablet by mouth daily.    . rivaroxaban (XARELTO) 20 MG TABS tablet Take 1 tablet (20 mg total) by mouth daily with supper. 90 tablet 3  . potassium chloride (K-DUR) 10 MEQ tablet Take 1 tablet (10 mEq total) by mouth daily. 30 tablet 3   No current facility-administered medications for this encounter.     No Known Allergies  Social History   Social History  . Marital status: Single    Spouse name: N/A  . Number of children: N/A  . Years of education: N/A   Occupational History  . Emergency planning/management officer for a [employer]    Social History Main Topics  . Smoking status: Never Smoker  . Smokeless tobacco: Never Used  . Alcohol use Yes     Comment: 1-2 drinks/week  . Drug use: No  . Sexual activity: Not on file   Other Topics Concern  . Not on file   Social History Narrative   Pt lives alone in [place]    Family History  Problem Relation Age of Onset  . Diabetes  Mother   . GI Disease Mother   . Cancer Mother   . Heart failure Mother        from rheumatic fever  . Obesity Sister   . Other Father        choked on a steak    ROS- All systems are reviewed and negative except as per the HPI above  Physical Exam: Vitals:   02/19/17 1457  BP: (!) 158/94  Pulse: 71  Weight: 252 lb 9.6 oz (114.6 kg)  Height: 6\' 1"  (1.854 m)   Wt Readings from Last 3 Encounters:  02/19/17 252 lb 9.6 oz (114.6 kg)  02/18/17 250 lb 12.8 oz (113.8 kg)   01/27/17 249 lb 6.4 oz (113.1 kg)    Labs: Lab Results  Component Value Date   NA 142 01/27/2017   K 3.8 01/27/2017   CL 99 01/27/2017   CO2 28 01/27/2017   GLUCOSE 87 01/27/2017   BUN 15 01/27/2017   CREATININE 1.19 01/27/2017   CALCIUM 9.6 01/27/2017   MG 1.8 12/28/2016   No results found for: INR Lab Results  Component Value Date   CHOL 162 12/29/2016   HDL 52 12/29/2016   LDLCALC 99 12/29/2016   TRIG 53 12/29/2016     GEN- The patient is well appearing, alert and oriented x 3 today.   Head- normocephalic, atraumatic Eyes-  Sclera clear, conjunctiva pink Ears- hearing intact Oropharynx- clear Neck- supple, no JVP Lymph- no cervical lymphadenopathy Lungs- Clear to ausculation bilaterally, normal work of breathing Heart-irregular rate and rhythm, no murmurs, rubs or gallops, PMI not laterally displaced GI- soft, NT, ND, + BS Extremities- no clubbing, cyanosis, or edema MS- no significant deformity or atrophy Skin- no rash or lesion Psych- euthymic mood, full affect Neuro- strength and sensation are intact  EKG-afib rate controlled at 71 bpm, qrs int 94 ms, qtc 447 ms Epic records reviewed Echo- 12/29/16-Study Conclusions  - Left ventricle: The cavity size was mildly dilated. Wall   thickness was increased in a pattern of severe LVH. There was   moderate concentric hypertrophy. Systolic function was moderately   to severely reduced. The estimated ejection fraction was in the   range of 30% to 35%. Wall motion was normal; there were no   regional wall motion abnormalities. Doppler parameters are   consistent with restrictive physiology, indicative of decreased   left ventricular diastolic compliance and/or increased left   atrial pressure. - Aortic valve: Trileaflet; normal thickness leaflets. There was no   regurgitation. - Aortic root: The aortic root was normal in size. - Mitral valve: There was mild regurgitation. - Left atrium: The atrium was severely  dilated. 61mm - Right ventricle: The cavity size was normal. Wall thickness was   normal. Systolic function was normal. - Right atrium: The atrium was normal in size. - Tricuspid valve: There was mild regurgitation. - Pulmonary arteries: Systolic pressure was moderately increased.   PA peak pressure: 50 mm Hg (S). - Inferior vena cava: The vessel was normal in size. - Pericardium, extracardiac: A trivial pericardial effusion was   identified posterior to the heart. Features were not consistent   with tamponade physiology.   Assessment and Plan: 1. Persistent symptomatic afib Successful cardioversion but with ERAF With structural heart changes, EF moderately reduced,feel that pt will do better long term with restoration of SR Left atrial size may undermine ability to maintain SR long term, but if can restore SR may allow for remodeling  of heart  structures and improve atrial size as well as EF. Options to restore SR discussed, he is not an candidate for multaq or 1c agents due to LV dysfunction Amiodarone would be ok short term but not a good long term agent due to side effect profile I think tikosyn/sotalol may be the best options Qtc looks acceptable(424 ms in SR) No bendaryl use No missed doses of xarelto since starting drug Meets the income criteria for drug assistance with dofetilide I have asked social worker to talk to pt re assistance since he is without insurance for pending hospitalization He usually runs a K+ below 4 Will start K+ 120 meq a day, last mag was at 1.8. Will have pharmD to review drugs  Anticipated admission date is 10/29  Elvina Sidle. [facility] [address] [place], [place] [address] [phone]

## 2017-02-19 NOTE — Patient Instructions (Signed)
Start potassium 10meq once a day 

## 2017-02-20 ENCOUNTER — Telehealth: Payer: Self-pay | Admitting: Licensed Clinical Social Worker

## 2017-02-20 NOTE — Telephone Encounter (Signed)
CSW contacted patient to assist with resources as he is uninsured and has an upcoming planned admission. CSW encouraged patient to contact Financial Assistance to discuss discount program. Patient verbalizes understanding and will follow through. CSW available if needed. Lasandra BeechJackie Zaveon Gillen, LCSW, CCSW-MCS [phone]

## 2017-02-24 ENCOUNTER — Ambulatory Visit (HOSPITAL_BASED_OUTPATIENT_CLINIC_OR_DEPARTMENT_OTHER): Payer: Self-pay | Attending: Physician Assistant | Admitting: Cardiology

## 2017-02-24 VITALS — Ht 73.0 in | Wt 250.0 lb

## 2017-02-24 DIAGNOSIS — G4733 Obstructive sleep apnea (adult) (pediatric): Secondary | ICD-10-CM | POA: Insufficient documentation

## 2017-02-24 DIAGNOSIS — R0683 Snoring: Secondary | ICD-10-CM

## 2017-02-26 ENCOUNTER — Telehealth: Payer: Self-pay | Admitting: Pharmacist

## 2017-02-26 NOTE — Telephone Encounter (Signed)
Medication list reviewed in anticipation of upcoming Tikosyn initiation. Patient is not taking any contraindicated or QTc prolonging medications.   Patient is anticoagulated on Xarelto 20mg  daily on the appropriate dose (CrCl > 14800mL/min). Please ensure that patient has not missed any anticoagulation doses in the 3 weeks prior to Tikosyn initiation.   K was slightly low at 3.8 on last BMET drawn 1 month ago and may need to be repleted prior to Tikosyn start.  Patient will need to be counseled to avoid use of Benadryl while on Tikosyn and in the 2-3 days prior to Tikosyn initiation.

## 2017-03-03 ENCOUNTER — Ambulatory Visit (HOSPITAL_COMMUNITY)
Admission: RE | Admit: 2017-03-03 | Discharge: 2017-03-03 | Disposition: A | Discharge status: Home / Self Care | Payer: Self-pay | Source: Ambulatory Visit | Attending: Nurse Practitioner | Admitting: Nurse Practitioner

## 2017-03-03 ENCOUNTER — Inpatient Hospital Stay (HOSPITAL_COMMUNITY)
Admission: AD | Admit: 2017-03-03 | Discharge: 2017-03-06 | DRG: 309 | Disposition: A | Discharge status: Home / Self Care | Payer: Self-pay | Source: Ambulatory Visit | Attending: Internal Medicine | Admitting: Internal Medicine

## 2017-03-03 ENCOUNTER — Encounter (HOSPITAL_COMMUNITY): Payer: Self-pay

## 2017-03-03 VITALS — BP 134/100 | HR 78 | Wt 249.8 lb

## 2017-03-03 DIAGNOSIS — I481 Persistent atrial fibrillation: Principal | ICD-10-CM | POA: Diagnosis present

## 2017-03-03 DIAGNOSIS — I4819 Other persistent atrial fibrillation: Secondary | ICD-10-CM

## 2017-03-03 DIAGNOSIS — I5022 Chronic systolic (congestive) heart failure: Secondary | ICD-10-CM | POA: Diagnosis present

## 2017-03-03 DIAGNOSIS — Z5181 Encounter for therapeutic drug level monitoring: Secondary | ICD-10-CM

## 2017-03-03 DIAGNOSIS — Z8249 Family history of ischemic heart disease and other diseases of the circulatory system: Secondary | ICD-10-CM

## 2017-03-03 DIAGNOSIS — Z7901 Long term (current) use of anticoagulants: Secondary | ICD-10-CM

## 2017-03-03 DIAGNOSIS — Z79899 Other long term (current) drug therapy: Secondary | ICD-10-CM

## 2017-03-03 DIAGNOSIS — E876 Hypokalemia: Secondary | ICD-10-CM | POA: Diagnosis present

## 2017-03-03 DIAGNOSIS — I110 Hypertensive heart disease with heart failure: Secondary | ICD-10-CM | POA: Diagnosis present

## 2017-03-03 DIAGNOSIS — Z8349 Family history of other endocrine, nutritional and metabolic diseases: Secondary | ICD-10-CM

## 2017-03-03 DIAGNOSIS — Z597 Insufficient social insurance and welfare support: Secondary | ICD-10-CM

## 2017-03-03 DIAGNOSIS — Z833 Family history of diabetes mellitus: Secondary | ICD-10-CM

## 2017-03-03 DIAGNOSIS — Z8379 Family history of other diseases of the digestive system: Secondary | ICD-10-CM

## 2017-03-03 DIAGNOSIS — F1010 Alcohol abuse, uncomplicated: Secondary | ICD-10-CM | POA: Diagnosis present

## 2017-03-03 LAB — BASIC METABOLIC PANEL
ANION GAP: 6 (ref 5–15)
BUN: 13 mg/dL (ref 6–20)
CALCIUM: 9.4 mg/dL (ref 8.9–10.3)
CO2: 32 mmol/L (ref 22–32)
CREATININE: 1.12 mg/dL (ref 0.61–1.24)
Chloride: 102 mmol/L (ref 101–111)
GFR calc Af Amer: >60 mL/min (ref >60)
GLUCOSE: 95 mg/dL (ref 65–99)
Potassium: 3.7 mmol/L (ref 3.5–5.1)
Sodium: 140 mmol/L (ref 135–145)

## 2017-03-03 LAB — POTASSIUM
POTASSIUM: 3.5 mmol/L (ref 3.5–5.1)
Potassium: 3.9 mmol/L (ref 3.5–5.1)

## 2017-03-03 LAB — MAGNESIUM
MAGNESIUM: 2.1 mg/dL (ref 1.7–2.4)
Magnesium: 1.8 mg/dL (ref 1.7–2.4)

## 2017-03-03 MED ORDER — ADULT MULTIVITAMIN W/MINERALS CH
1.0000 | ORAL_TABLET | Freq: Every day | ORAL | Status: DC
  Administered 2017-03-04 – 2017-03-06 (×3): 1 via ORAL
  Filled 2017-03-03 (×4): qty 1

## 2017-03-03 MED ORDER — VITAMIN D 1000 UNITS PO TABS
1000.0000 [IU] | ORAL_TABLET | Freq: Every day | ORAL | Status: DC
  Administered 2017-03-04 – 2017-03-05 (×2): 1000 [IU] via ORAL
  Filled 2017-03-03 (×3): qty 1

## 2017-03-03 MED ORDER — RIVAROXABAN 20 MG PO TABS
20.0000 mg | ORAL_TABLET | Freq: Every day | ORAL | Status: DC
  Administered 2017-03-03 – 2017-03-05 (×3): 20 mg via ORAL
  Filled 2017-03-03 (×3): qty 1

## 2017-03-03 MED ORDER — POTASSIUM CHLORIDE 20 MEQ PO PACK
40.0000 meq | PACK | Freq: Once | ORAL | Status: AC
  Administered 2017-03-03: 40 meq via ORAL
  Filled 2017-03-03: qty 2

## 2017-03-03 MED ORDER — DOFETILIDE 500 MCG PO CAPS
500.0000 ug | ORAL_CAPSULE | Freq: Two times a day (BID) | ORAL | Status: DC
  Filled 2017-03-03: qty 1

## 2017-03-03 MED ORDER — SODIUM CHLORIDE 0.9% FLUSH: 3.0000 mL | INTRAVENOUS | Status: DC | PRN

## 2017-03-03 MED ORDER — SODIUM CHLORIDE 0.9 % IV SOLN: 250.0000 mL | INTRAVENOUS | Status: DC | PRN

## 2017-03-03 MED ORDER — POTASSIUM CHLORIDE CRYS ER 10 MEQ PO TBCR
10.0000 meq | EXTENDED_RELEASE_TABLET | Freq: Every day | ORAL | Status: DC
  Filled 2017-03-03 (×3): qty 1

## 2017-03-03 MED ORDER — METOPROLOL TARTRATE 25 MG PO TABS
25.0000 mg | ORAL_TABLET | Freq: Two times a day (BID) | ORAL | Status: DC
  Administered 2017-03-03 – 2017-03-06 (×6): 25 mg via ORAL
  Filled 2017-03-03 (×6): qty 1

## 2017-03-03 MED ORDER — POTASSIUM CHLORIDE CRYS ER 20 MEQ PO TBCR
30.0000 meq | EXTENDED_RELEASE_TABLET | Freq: Once | ORAL | Status: AC
  Administered 2017-03-03: 18:00:00 30 meq via ORAL
  Filled 2017-03-03: qty 1

## 2017-03-03 MED ORDER — MAGNESIUM SULFATE IN D5W 1-5 GM/100ML-% IV SOLN
1.0000 g | Freq: Once | INTRAVENOUS | Status: AC
  Administered 2017-03-03: 1 g via INTRAVENOUS
  Filled 2017-03-03: qty 100

## 2017-03-03 MED ORDER — SODIUM CHLORIDE 0.9% FLUSH
3.0000 mL | Freq: Two times a day (BID) | INTRAVENOUS | Status: DC
  Administered 2017-03-03 – 2017-03-05 (×5): 3 mL via INTRAVENOUS

## 2017-03-03 MED ORDER — LISINOPRIL 20 MG PO TABS
20.0000 mg | ORAL_TABLET | Freq: Every day | ORAL | Status: DC
  Filled 2017-03-03: qty 1

## 2017-03-03 NOTE — H&P (Signed)
Cardiology Admission History and Physical:   Patient ID: Cory [name]; MRN: [id number]; DOB: [birth year]   Admission date: 03/03/2017  Primary Care Provider: Patient, No Pcp Per Primary Cardiologist: Dr. Mayford Knife   Chief Complaint:  Here for Tikosyn initiation  Patient Profile:   Cory [name] is a 61 y.o. male with a history of HTN, new finding of AFib in August 2018 incidentally noted at a [facility] visit for cough, w/u noted CM with EF 35%  History of Present Illness:   Cory [name] was referred from our [facility] care office to the AFib clinic for evaluation and management of his AFib when he had recurrent AFib after his DCCV.  Given his CM  And question iof AFib plays a role was felt Tikosyn was the best choice for AAD options, and after discussion planned for admission.  His medicines have been reviewed by [facility] pharmacist without any QT prolonging medicines or contraindicated medicines.  AFib Hx Xarelto for a/c Diagnosed 12/29/16 (unknonw onset) DCCV to SR 02/03/17 Found back in AF 02/18/17 No prior AAD  The patient confirms Tikosyn education, we re-visited this potential for pro-arrhythmia/QT prolongation, he confirms no missed doses of his xarelto for the last 3 weeks.    His AF symptoms or somewhat vague, he feels like he did feel generally better after his DCCV though could not say with any specificity when he went out of rhythm.  On/off he finds he gets winded doing things he typically does without difficulty.  Occassionally feels palpitations, not racing.   Past Medical History:  Diagnosis Date  . Atrial fibrillation with controlled ventricular rate (HCC) 12/28/2016  . Atrial fibrillation, persistent (HCC) 12/28/2016  . Chronic systolic CHF (congestive heart failure) (HCC) 12/30/2016  . Elevated troponin 12/28/2016  . HTN (hypertension)   . Hypertensive urgency 12/28/2016  . Hypokalemia 12/28/2016  . Psoriasis     Past Surgical History:  Procedure Laterality Date  .  CARDIOVERSION N/A 02/03/2017   Procedure: CARDIOVERSION;  Surgeon: Pricilla Riffle, MD;  Location: [facility] ENDOSCOPY;  Service: Cardiovascular;  Laterality: N/A;  . KNEE ARTHROCENTESIS       Medications Prior to Admission: Prior to Admission medications   Medication Sig Start Date End Date Taking? Authorizing Provider  cholecalciferol (VITAMIN D) 1000 units tablet Take 1,000 Units by mouth daily.    [provider]  lisinopril (PRINIVIL,ZESTRIL) 20 MG tablet Take 20 mg by mouth daily.    [provider]  metoprolol tartrate (LOPRESSOR) 25 MG tablet Take 1 tablet (25 mg total) by mouth 2 (two) times daily. 01/29/17   Dyann Kief, PA-C  Multiple Vitamin (MULTIVITAMIN WITH MINERALS) TABS tablet Take 1 tablet by mouth daily.    [provider]  potassium chloride (K-DUR) 10 MEQ tablet Take 1 tablet (10 mEq total) by mouth daily. 02/19/17   Newman Nip, NP  rivaroxaban (XARELTO) 20 MG TABS tablet Take 1 tablet (20 mg total) by mouth daily with supper. 01/27/17   Quintella Reichert, MD     Allergies:   No Known Allergies  Social History:   Social History   Social History  . Marital status: Single    Spouse name: N/A  . Number of children: N/A  . Years of education: N/A   Occupational History  . Emergency planning/management officer for a [employer]    Social History Main Topics  . Smoking status: Never Smoker  . Smokeless tobacco: Never Used  . Alcohol use Yes  Comment: 1-2 drinks/week  . Drug use: No  . Sexual activity: Not on file   Other Topics Concern  . Not on file   Social History Narrative   Pt lives alone in [place]    Family History:   The patient's family history includes Cancer in his mother; Diabetes in his mother; GI Disease in his mother; Heart failure in his mother; Obesity in his sister; Other in his father.    ROS:  Please see the history of present illness.  All other ROS reviewed and negative.     Physical Exam/Data:   Vitals:    03/03/17 1442  BP: (!) 132/107  Pulse: 79  Resp: 18  Temp: 99 F (37.2 C)  TempSrc: Oral  SpO2: 99%  Weight: 248 lb 9.6 oz (112.8 kg)  Height: 6\' 1"  (1.854 m)   No intake or output data in the 24 hours ending 03/03/17 1458 Filed Weights   03/03/17 1442  Weight: 248 lb 9.6 oz (112.8 kg)   Body mass index is 32.8 kg/m.  General:  Well nourished, well developed, in no acute distress HEENT: normal Lymph: no adenopathy Neck: no JVD Endocrine:  No thryomegaly Vascular: No carotid bruits Cardiac:  IRRR; no murmurs, gallops or rubs Lungs:  CTA b/l, no wheezing, rhonchi or rales  Abd: soft, nontender Ext: no edema Musculoskeletal:  No deformities Skin: warm and dry  Neuro:   No gross focal abnormalities noted Psych:  Normal affect    EKG:  personally reviewed and demonstrates  03/03/17 AFib 73bpm, QRS 94ms, QT measured by myself is QTc 02/03/17 SB 56bpm, borderline 1st AVblock PR , QRS , measured QT , QTc corrected  Relevant CV Studies:  12/29/16: TTE Study Conclusions - Left ventricle: The cavity size was mildly dilated. Wall   thickness was increased in a pattern of severe LVH. There was   moderate concentric hypertrophy. Systolic function was moderately   to severely reduced. The estimated ejection fraction was in the   range of 30% to 35%. Wall motion was normal; there were no   regional wall motion abnormalities. Doppler parameters are   consistent with restrictive physiology, indicative of decreased   left ventricular diastolic compliance and/or increased left   atrial pressure. - Aortic valve: Trileaflet; normal thickness leaflets. There was no   regurgitation. - Aortic root: The aortic root was normal in size. - Mitral valve: There was mild regurgitation. - Left atrium: The atrium was severely dilated. 61mm - Right ventricle: The cavity size was normal. Wall thickness was   normal. Systolic function was normal. - Right atrium:  The atrium was normal in size. - Tricuspid valve: There was mild regurgitation. - Pulmonary arteries: Systolic pressure was moderately increased.   PA peak pressure: 50 mm Hg (S). - Inferior vena cava: The vessel was normal in size. - Pericardium, extracardiac: A trivial pericardial effusion was   identified posterior to the heart. Features were not consistent   with tamponade physiology  Laboratory Data:  Chemistry Recent Labs Lab 03/03/17 0959  NA 140  K 3.7  CL 102  CO2 32  GLUCOSE 95  BUN 13  CREATININE 1.12  CALCIUM 9.4  GFRNONAA >60  GFRAA >60  ANIONGAP 6     Radiology/Studies:  No results found.  Assessment and Plan:    1. Persistent AFib, here for Tikosyn initiation     CHA2DS2Vasc is at least 2, on xarelto     QT measured by myself  380-45200ms, QTc 419-44641ms      his SR QTc earlier this month was 424ms     K+ 3.7 (he took an additional 40meq at home) will recheck     Mag 1.8, replacement ordered     Creat 1.12 (calc clearance = 112)     Start Tikosyn at 500mcg BID  Discussed DCCV Wed if not in SR by then, he has had historically and is agreeable  2. HTN     LVH on his echo (PW 17.195mm, IVS 15.583mm)     BP somewhat elevated here on arrival, adjust as needed  3. CM     ? AF, ?  HTN     Exam appears compensated     On BB/ACE tx  4. P.HTN     Pending sleep study next week     No known pulmonary hx           For questions or updates, please contact [facility] Please consult [email or web address] for contact info under Cardiology/STEMI.    Signed, Sheilah PigeonRenee Lynn Ursuy, PA-C  03/03/2017 2:58 PM   EP Attending  Patient seen and examined. Agree with above. The patient presents today for initiation of dofetilide. He has a very large LA. He has symptomatic atrial fib. He has not missed any of his anti-coagulation. I have discussed the treatment options with the patient and he is willing to proceed with initiation of dofetilide. We will follow his QT  interval.  Leonia ReevesGregg Taylor,M.D.

## 2017-03-03 NOTE — Progress Notes (Signed)
Pharmacy Review for Dofetilide (Tikosyn) Initiation  Admit Complaint: 61 y.o. male admitted 03/03/2017 with atrial fibrillation to be initiated on dofetilide.   Assessment:  Patient Exclusion Criteria: If any screening criteria checked as "Yes", then  patient  should NOT receive dofetilide until criteria item is corrected. If "Yes" please indicate correction plan.  YES  NO Patient  Exclusion Criteria Correction Plan  []  [x]  Baseline QTc interval is greater than or equal to 440 msec. IF above YES box checked dofetilide contraindicated unless patient has ICD; then may proceed if QTc 500-550 msec or with known ventricular conduction abnormalities may proceed with QTc 550-600 msec. QTc =     []  [x]  Magnesium level is less than 1.8 mEq/l : Last magnesium:  Lab Results  Component Value Date   MG 1.8 03/03/2017         [x]  []  Potassium level is less than 4 mEq/l : Last potassium:  Lab Results  Component Value Date   K 3.7 03/03/2017       Had 40 meq with lunch and 30 meq x1 inpatient  []  [x]  Patient is known or suspected to have a digoxin level greater than 2 ng/ml: No results found for: DIGOXIN    []  [x]  Creatinine clearance less than 20 ml/min (calculated using Cockcroft-Gault, actual body weight and serum creatinine): Estimated Creatinine Clearance: 92.4 mL/min (by C-G formula based on SCr of 1.12 mg/dL).    []  [x]  Patient has received drugs known to prolong the QT intervals within the last 48 hours (phenothiazines, tricyclics or tetracyclic antidepressants, erythromycin, H-1 antihistamines, cisapride, fluoroquinolones, azithromycin). Drugs not listed above may have an, as yet, undetected potential to prolong the QT interval, updated information on QT prolonging agents is available at this website:QT prolonging agents   []  [x]  Patient received a dose of hydrochlorothiazide (Oretic) alone or in any combination including triamterene (Dyazide, Maxzide) in the last 48 hours.   []  [x]   Patient received a medication known to increase dofetilide plasma concentrations prior to initial dofetilide dose:  . Trimethoprim (Primsol, Proloprim) in the last 36 hours . Verapamil (Calan, Verelan) in the last 36 hours or a sustained release dose in the last 72 hours . Megestrol (Megace) in the last 5 days  . Cimetidine (Tagamet) in the last 6 hours . Ketoconazole (Nizoral) in the last 24 hours . Itraconazole (Sporanox) in the last 48 hours  . Prochlorperazine (Compazine) in the last 36 hours    []  [x]  Patient is known to have a history of torsades de pointes; congenital or acquired long QT syndromes.   []  [x]  Patient has received a Class 1 antiarrhythmic with less than 2 half-lives since last dose. (Disopyramide, Quinidine, Procainamide, Lidocaine, Mexiletine, Flecainide, Propafenone)   []  [x]  Patient has received amiodarone therapy in the past 3 months or amiodarone level is greater than 0.3 ng/ml.    Patient has been appropriately anticoagulated with Xarelto.  Ordering provider was confirmed at [email or web address] if they are not listed on the [facility] Authorized Prescribers list.  Goal of Therapy: Follow renal function, electrolytes, potential drug interactions, and dose adjustment. Provide education and 1 week supply at discharge.  Plan:  [x]   Physician selected initial dose within range recommended for patients level of renal function - will monitor for response.  []   Physician selected initial dose outside of range recommended for patients level of renal function - will discuss if the dose should be altered at this time.   Select One Calculated CrCl  Dose  q12h  [x]  > 60 ml/min 500 mcg  []  40-60 ml/min 250 mcg  []  20-40 ml/min 125 mcg   2. Follow up QTc after the first 5 doses, renal function, electrolytes (K & Mg) daily x 3     days, dose adjustment, success of initiation and facilitate 1 week discharge supply as     clinically indicated.  3. Initiate Tikosyn education video  (Call [phone] and ask for Tikosyn Video # 116).  4. Place Enrollment Form on the chart for discharge supply of dofetilide.   Loura Back, PharmD, BCPS Clinical Pharmacist Main pharmacy [phone] 03/03/2017 3:32 PM

## 2017-03-03 NOTE — Progress Notes (Signed)
Primary Care Physician: Patient, No Pcp Per Referring Physician: Dr. Lorelle Formosaurner   Cory [name] is a 61 y.o. male with a h/o hypertension went to urgent care for upper respiratory infection and was found to be in atrial fibrillation and was sent to the ER, 12/28/2016, also had hypertensive urgency. He said dyspnea on exertion for about 3 weeks.CHADSVASC=2 HTN & CHF.2-D echo showed mildly dilated LV with severe LVH, moderate concentric hypertrophy and moderately to severe reduced LV function EF 30-35%. Left atrium was severely dilated and mild MR. He was discharged home on Xarelto and beta blocker with plans for cardioversion in 3 weeks if still in atrial fibrillation. He had cardioversion 10/1 but had early return of afib after 3-4 days. He felt better in SR with more energy and less dyspnea. Unfortunately on f/u with Herma CarsonMichelle Lenze, PA, yesterday, he was back in afib.   He is in the afib office to discuss options to restore SR. He is pending sleep study next week. He drinks minimal alcohol, no significant caffeine or tobacco. He works as a Neurosurgeoncontractor on constructions sites, self employed, but no Community education officerinsurance. He is pending getting insurance as of January 1.  Today, he denies symptoms of palpitations, chest pain, shortness of breath, orthopnea, PND, lower extremity edema, dizziness, presyncope, syncope, or neurologic sequela. The patient is tolerating medications without difficulties and is otherwise without complaint today.   Past Medical History:  Diagnosis Date  . Atrial fibrillation with controlled ventricular rate (HCC) 12/28/2016  . Atrial fibrillation, persistent (HCC) 12/28/2016  . Chronic systolic CHF (congestive heart failure) (HCC) 12/30/2016  . Elevated troponin 12/28/2016  . HTN (hypertension)   . Hypertensive urgency 12/28/2016  . Hypokalemia 12/28/2016  . Psoriasis    Past Surgical History:  Procedure Laterality Date  . CARDIOVERSION N/A 02/03/2017   Procedure: CARDIOVERSION;   Surgeon: Pricilla Riffleoss, Paula V, MD;  [facility];  Service: Cardiovascular;  Laterality: N/A;  . KNEE ARTHROCENTESIS      Current Outpatient Prescriptions  Medication Sig Dispense Refill  . cholecalciferol (VITAMIN D) 1000 units tablet Take 1,000 Units by mouth daily.    Marland Kitchen. lisinopril (PRINIVIL,ZESTRIL) 20 MG tablet Take 20 mg by mouth daily.    . metoprolol tartrate (LOPRESSOR) 25 MG tablet Take 1 tablet (25 mg total) by mouth 2 (two) times daily. 180 tablet 3  . Multiple Vitamin (MULTIVITAMIN WITH MINERALS) TABS tablet Take 1 tablet by mouth daily.    . potassium chloride (K-DUR) 10 MEQ tablet Take 1 tablet (10 mEq total) by mouth daily. 30 tablet 3  . rivaroxaban (XARELTO) 20 MG TABS tablet Take 1 tablet (20 mg total) by mouth daily with supper. 90 tablet 3   No current facility-administered medications for this encounter.     No Known Allergies  Social History   Social History  . Marital status: Single    Spouse name: N/A  . Number of children: N/A  . Years of education: N/A   Occupational History  . 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 Smoking status: Never Smoker  . Smokeless tobacco: Never Used  . Alcohol use Yes     Comment: 1-2 drinks/week  . Drug use: No  . Sexual activity: Not on file   Other Topics Concern  . Not on file   Social History Narrative   [place] in [place]    Family History  Problem Relation Age of Onset  . Diabetes  Mother   . GI Disease Mother   . Cancer Mother   . Heart failure Mother        from rheumatic fever  . Obesity Sister   . Other Father        choked on a steak    ROS- All systems are reviewed and negative except as per the HPI above  Physical Exam: Vitals:   03/03/17 1013  BP: (!) 134/100  Pulse: 78  SpO2: 98%  Weight: 249 lb 12 oz (113.3 kg)   Wt Readings from Last 3 Encounters:  03/03/17 249 lb 12 oz (113.3 kg)  02/24/17 250 lb (113.4 kg)  02/19/17 252 lb 9.6  oz (114.6 kg)    Labs: Lab Results  Component Value Date   NA 140 03/03/2017   K 3.7 03/03/2017   CL 102 03/03/2017   CO2 32 03/03/2017   GLUCOSE 95 03/03/2017   BUN 13 03/03/2017   CREATININE 1.12 03/03/2017   CALCIUM 9.4 03/03/2017   MG 1.8 03/03/2017   No results found for: INR Lab Results  Component Value Date   CHOL 162 12/29/2016   HDL 52 12/29/2016   LDLCALC 99 12/29/2016   TRIG 53 12/29/2016     GEN- The patient is well appearing, alert and oriented x 3 today.   Head- normocephalic, atraumatic Eyes-  Sclera clear, conjunctiva pink Ears- hearing intact Oropharynx- clear Neck- supple, no JVP Lymph- no cervical lymphadenopathy Lungs- Clear to ausculation bilaterally, normal work of breathing Heart-irregular rate and rhythm, no murmurs, rubs or gallops, PMI not laterally displaced GI- soft, NT, ND, + BS Extremities- no clubbing, cyanosis, or edema MS- no significant deformity or atrophy Skin- no rash or lesion Psych- euthymic mood, full affect Neuro- strength and sensation are intact  EKG-afib rate controlled at 73 bpm, qrs int 98 ms, qtc 447 ms Epic records reviewed Echo- 12/29/16-Study Conclusions  - Left ventricle: The cavity size was mildly dilated. Wall   thickness was increased in a pattern of severe LVH. There was   moderate concentric hypertrophy. Systolic function was moderately   to severely reduced. The estimated ejection fraction was in the   range of 30% to 35%. Wall motion was normal; there were no   regional wall motion abnormalities. Doppler parameters are   consistent with restrictive physiology, indicative of decreased   left ventricular diastolic compliance and/or increased left   atrial pressure. - Aortic valve: Trileaflet; normal thickness leaflets. There was no   regurgitation. - Aortic root: The aortic root was normal in size. - Mitral valve: There was mild regurgitation. - Left atrium: The atrium was severely dilated. 61mm -  Right ventricle: The cavity size was normal. Wall thickness was   normal. Systolic function was normal. - Right atrium: The atrium was normal in size. - Tricuspid valve: There was mild regurgitation. - Pulmonary arteries: Systolic pressure was moderately increased.   PA peak pressure: 50 mm Hg (S). - Inferior vena cava: The vessel was normal in size. - Pericardium, extracardiac: A trivial pericardial effusion was   identified posterior to the heart. Features were not consistent   with tamponade physiology.   Assessment and Plan: 1. Persistent symptomatic afib Successful cardioversion but with ERAF With structural heart changes, EF moderately reduced,feel that pt will do better long term with restoration of SR Left atrial size may undermine ability to maintain SR long term, but if can restore SR may allow for remodeling  of heart structures and improve atrial size  as well as EF. Options to restore SR discussed, he is not an candidate for multaq or 1c agents due to LV dysfunction Amiodarone would be ok short term but not a good long term agent due to side effect profile I think tikosyn/sotalol may be the best options, as pt would like to restore SR asap to help with heart dysfunction Qtc looks acceptable(424 ms in SR), 447 ms in afib today No bendaryl use No missed doses of xarelto since starting drug Meets the income criteria for drug assistance with dofetilide, has forms to fill out and we can fax in for pt as soon as d/c is known Child psychotherapist talked to pt re financial  assistance since he is without insurance for pending hospitalization, pt is aware of cost and wants to proceed  He usually runs a K+ below 4 Despite starting  K+ 10 meq a day, he is still low with K+ 3.7, mag was at 1.8,will take 40 meq today at lunch with food and will need recheck on admission PharmD reviewed drugs without any qtc prolonging drugs on board   Bed placement feels that bed availability will be possible  early afternoon Renee,EP PA, made aware of pending admission  Lupita Leash C. [facility] Fol[facility] [address] [place], [place] [address] [phone]

## 2017-03-03 NOTE — Progress Notes (Addendum)
Dr. Santiago Gladarncelli paged the following at 2140:  6E23: Cory [name], K. New tikosyn patient. K: 3.9 on admission. 30mEq administered PO. Recheck 3.5. need >4 to start first dose.    2205: order for 40mEq potassium PO powder packet received from Dr. Santiago Gladarncelli. Will administer and recheck patient in 2 hours.   0150: potassium is finally 4.2. Carncelli paged at 2am. First dose postponed until 8am for scheduling purposes.

## 2017-03-04 LAB — BASIC METABOLIC PANEL
Anion gap: 6 (ref 5–15)
BUN: 15 mg/dL (ref 6–20)
CHLORIDE: 104 mmol/L (ref 101–111)
CO2: 28 mmol/L (ref 22–32)
CREATININE: 1.17 mg/dL (ref 0.61–1.24)
Calcium: 8.8 mg/dL — ABNORMAL LOW (ref 8.9–10.3)
GFR calc Af Amer: >60 mL/min (ref >60)
GFR calc non Af Amer: >60 mL/min (ref >60)
GLUCOSE: 104 mg/dL — AB (ref 65–99)
POTASSIUM: 4.2 mmol/L (ref 3.5–5.1)
Sodium: 138 mmol/L (ref 135–145)

## 2017-03-04 LAB — MAGNESIUM: Magnesium: 2.1 mg/dL (ref 1.7–2.4)

## 2017-03-04 MED ORDER — POTASSIUM CHLORIDE CRYS ER 20 MEQ PO TBCR
40.0000 meq | EXTENDED_RELEASE_TABLET | Freq: Every day | ORAL | Status: DC
  Administered 2017-03-04 – 2017-03-06 (×3): 40 meq via ORAL
  Filled 2017-03-04 (×3): qty 2

## 2017-03-04 MED ORDER — SODIUM CHLORIDE 0.9 % IV SOLN
250.0000 mL | INTRAVENOUS | Status: DC
  Administered 2017-03-05: 250 mL via INTRAVENOUS

## 2017-03-04 MED ORDER — HYDROCORTISONE 1 % EX CREA
1.0000 "application " | TOPICAL_CREAM | Freq: Three times a day (TID) | CUTANEOUS | Status: DC | PRN
  Filled 2017-03-04: qty 28

## 2017-03-04 MED ORDER — SODIUM CHLORIDE 0.9% FLUSH
3.0000 mL | Freq: Two times a day (BID) | INTRAVENOUS | Status: DC
  Administered 2017-03-04 – 2017-03-05 (×2): 3 mL via INTRAVENOUS

## 2017-03-04 MED ORDER — SODIUM CHLORIDE 0.9% FLUSH: 3.0000 mL | INTRAVENOUS | Status: DC | PRN

## 2017-03-04 MED ORDER — HYDRALAZINE HCL 25 MG PO TABS
25.0000 mg | ORAL_TABLET | Freq: Three times a day (TID) | ORAL | Status: DC
  Administered 2017-03-04 – 2017-03-06 (×6): 25 mg via ORAL
  Filled 2017-03-04 (×6): qty 1

## 2017-03-04 MED ORDER — LISINOPRIL 40 MG PO TABS
40.0000 mg | ORAL_TABLET | Freq: Every day | ORAL | Status: DC
  Administered 2017-03-04 – 2017-03-06 (×3): 40 mg via ORAL
  Filled 2017-03-04 (×3): qty 1

## 2017-03-04 MED ORDER — DOFETILIDE 500 MCG PO CAPS
500.0000 ug | ORAL_CAPSULE | Freq: Two times a day (BID) | ORAL | Status: DC
  Administered 2017-03-04 – 2017-03-05 (×3): 500 ug via ORAL
  Filled 2017-03-04 (×3): qty 1

## 2017-03-04 NOTE — Care Management Note (Signed)
Case Management Note  Patient Details  Name: Cory RudKenneth A [name] MRN: [id number] Date of Birth: 12/14/1955  Subjective/Objective:   Pt presented for uncontrolled afib and Tikosyn load.  Pt works part-time and has no Community education officerinsurance.  Pt on Xarelto x 1 month and now will start Tikosyn.  Pt hopes to be able to get insurance in January and has the pt assistance forms from the afib clinic to fill out for assistance from the drug companies. Pt independent and lives alone.   Action/Plan: Checked with pt's pharmacy, [facility] ([address] [place] [phone]).  [facility] can get Tikosyn within 2 days.  1 month supply Xarelto 20 mg is $532.99.  1 month supply of dofetilide 500mg  (60) is $513.99.  Pt will receive 7 day supply of Tikosyn from [facility] Tikosyn fund. Pt will need paper script for 7 days with no refills and [facility]  Expected Discharge Date:  03/06/17               Expected Discharge Plan:  Home/Self Care  In-House Referral:  NA  Discharge planning Services  CM Consult, Medication Assistance  Post Acute Care Choice:  NA Choice offered to:  NA  DME Arranged:  N/A DME Agency:  NA  HH Arranged:  NA HH Agency:  NA  Status of Service:  In process, will continue to follow  If discussed at Long Length of Stay Meetings, dates discussed:    Additional Comments:  Verdene LennertGoldean, Sashia Campas K, RN 03/04/2017, 4:00 PM

## 2017-03-04 NOTE — Progress Notes (Signed)
Progress Note  Patient Name: Cory [name] Date of Encounter: 03/04/2017  Primary Cardiologist: Dr. Mayford Knife  Subjective   No complaints  Inpatient Medications    Scheduled Meds: . cholecalciferol  1,000 Units Oral Daily  . dofetilide  500 mcg Oral BID  . lisinopril  20 mg Oral Daily  . metoprolol tartrate  25 mg Oral BID  . multivitamin with minerals  1 tablet Oral Daily  . potassium chloride  10 mEq Oral Daily  . rivaroxaban  20 mg Oral Q supper  . sodium chloride flush  3 mL Intravenous Q12H   Continuous Infusions: . sodium chloride     PRN Meds: sodium chloride, sodium chloride flush   Vital Signs    Vitals:   03/03/17 1442 03/03/17 2117 03/04/17 0500  BP: (!) 132/107 (!) 158/108 (!) 155/107  Pulse: 79 62 (!) 59  Resp: 18  16  Temp: 99 F (37.2 C) 98.4 F (36.9 C) 97.6 F (36.4 C)  TempSrc: Oral Oral Oral  SpO2: 99% 99% 99%  Weight: 248 lb 9.6 oz (112.8 kg)  248 lb (112.5 kg)  Height: 6\' 1"  (1.854 m)      Intake/Output Summary (Last 24 hours) at 03/04/17 0800 Last data filed at 03/03/17 2200  Gross per 24 hour  Intake              400 ml  Output                0 ml  Net              400 ml   Filed Weights   03/03/17 1442 03/04/17 0500  Weight: 248 lb 9.6 oz (112.8 kg) 248 lb (112.5 kg)    Telemetry    AFib generally 60's-70's, once 8 beat NSVT (monomorphic) - Personally Reviewed  ECG    No new EKG - Personally Reviewed  Physical Exam   GEN: No acute distress.   Neck: No JVD Cardiac: RRR, no murmurs, rubs, or gallops.  Respiratory: CTA b/l. GI: Soft, nontender, non-distended  MS: No edema; No deformity. Neuro:  Nonfocal  Psych: Normal affect   Labs    Chemistry Recent Labs Lab 03/03/17 0959 03/03/17 1558 03/03/17 1955 03/04/17 0101  NA 140  --   --  138  K 3.7 3.9 3.5 4.2  CL 102  --   --  104  CO2 32  --   --  28  GLUCOSE 95  --   --  104*  BUN 13  --   --  15  CREATININE 1.12  --   --  1.17  CALCIUM 9.4  --   --   8.8*  GFRNONAA >60  --   --  >60  GFRAA >60  --   --  >60  ANIONGAP 6  --   --  6     HematologyNo results for input(s): WBC, RBC, HGB, HCT, MCV, MCH, MCHC, RDW, PLT in the last 168 hours.  Cardiac EnzymesNo results for input(s): TROPONINI in the last 168 hours. No results for input(s): TROPIPOC in the last 168 hours.   BNPNo results for input(s): BNP, PROBNP in the last 168 hours.   DDimer No results for input(s): DDIMER in the last 168 hours.   Radiology    No results found.  Cardiac Studies   12/29/16: TTE Study Conclusions - Left ventricle: The cavity size was mildly dilated. Wall thickness was increased in a pattern of severe LVH.  There was moderate concentric hypertrophy. Systolic function was moderately to severely reduced. The estimated ejection fraction was in the range of 30% to 35%. Wall motion was normal; there were no regional wall motion abnormalities. Doppler parameters are consistent with restrictive physiology, indicative of decreased left ventricular diastolic compliance and/or increased left atrial pressure. - Aortic valve: Trileaflet; normal thickness leaflets. There was no regurgitation. - Aortic root: The aortic root was normal in size. - Mitral valve: There was mild regurgitation. - Left atrium: The atrium was severely dilated. 61mm - Right ventricle: The cavity size was normal. Wall thickness was normal. Systolic function was normal. - Right atrium: The atrium was normal in size. - Tricuspid valve: There was mild regurgitation. - Pulmonary arteries: Systolic pressure was moderately increased. PA peak pressure: 50 mm Hg (S). - Inferior vena cava: The vessel was normal in size. - Pericardium, extracardiac: A trivial pericardial effusion was identified posterior to the heart. Features were not consistent with tamponade physiology  Patient Profile     61 y.o. male with a history of HTN, new finding of AFib in August 2018  [facility] noted CM with EF 35% admitted for tikosyn initiation  Assessment & Plan    1. Persistent AFib, here for Tikosyn initiation     CHA2DS2Vasc is at least 2, on xarelto     QT measured by myself 380-41200ms, QTc 419-42541ms, and reviewed by Dr. Ladona Ridgelaylor     his SR QTc earlier this month was 424ms  Tikosyn initiation delayed 2/2 K+, no dose last night      K+ 4.2     Mag 2.1     Creat stable at 1.17     Start Tikosyn at 500mcg BID  Discussed DCCV tomorrow if not in SR by then, he has had historically and is agreeable  2. HTN     LVH on his echo (PW 17.305mm, IVS 15.703mm)     increase his lisinopril  3. CM     ? AF, ?  HTN     Exam appears compensated     On BB/ACE tx  4. P.HTN     Pending sleep study next week     No known pulmonary hx    For questions or updates, please contact [facility] Please consult [email or web address] for contact info under Cardiology/STEMI.      Signed, Sheilah Pigeonenee Lynn Ursuy, PA-C  03/04/2017, 8:00 AM    EP Attending  Patient seen and examined. Agree with above. He is stable with initiation of dofetilide today. Plan DCCV tomorrow unless he returns to NSR on his own.  Leonia ReevesGregg Macio Kissoon,M.D.

## 2017-03-05 ENCOUNTER — Encounter (HOSPITAL_COMMUNITY)
Admission: AD | Disposition: A | Discharge status: Home / Self Care | Payer: Self-pay | Source: Ambulatory Visit | Attending: Internal Medicine

## 2017-03-05 LAB — BASIC METABOLIC PANEL
Anion gap: 7 (ref 5–15)
BUN: 13 mg/dL (ref 6–20)
CO2: 28 mmol/L (ref 22–32)
CREATININE: 1.04 mg/dL (ref 0.61–1.24)
Calcium: 8.9 mg/dL (ref 8.9–10.3)
Chloride: 102 mmol/L (ref 101–111)
GFR calc Af Amer: >60 mL/min (ref >60)
Glucose, Bld: 103 mg/dL — ABNORMAL HIGH (ref 65–99)
POTASSIUM: 3.9 mmol/L (ref 3.5–5.1)
SODIUM: 137 mmol/L (ref 135–145)

## 2017-03-05 LAB — POTASSIUM: POTASSIUM: 4 mmol/L (ref 3.5–5.1)

## 2017-03-05 LAB — MAGNESIUM: MAGNESIUM: 2 mg/dL (ref 1.7–2.4)

## 2017-03-05 SURGERY — CARDIOVERSION
Anesthesia: General

## 2017-03-05 MED ORDER — DOFETILIDE 250 MCG PO CAPS
375.0000 ug | ORAL_CAPSULE | Freq: Two times a day (BID) | ORAL | Status: DC
  Administered 2017-03-05 – 2017-03-06 (×2): 375 ug via ORAL
  Filled 2017-03-05 (×2): qty 1

## 2017-03-05 MED ORDER — POTASSIUM CHLORIDE CRYS ER 20 MEQ PO TBCR
20.0000 meq | EXTENDED_RELEASE_TABLET | Freq: Once | ORAL | Status: AC
  Administered 2017-03-05: 20 meq via ORAL
  Filled 2017-03-05: qty 1

## 2017-03-05 NOTE — Progress Notes (Addendum)
Dr. Vonzella NippleWosik with cardiology paged the following at 0420:   6E23: Cory [name], K. :Tikosyn patient, morning potassium came back at 3.9. Needs >4 per policy. Thank you, hope your night went well.  0440: orders received for 20meq oral potassium now.

## 2017-03-05 NOTE — Care Management (Addendum)
Case Management Note Initial Note Started By Cory Furlong RN, Case Manager 03-04-17 Patient Details  Name: Cory [name] MRN: [id number] Date of Birth: [birth year]  Subjective/Objective:   Pt presented for uncontrolled afib and Tikosyn load.  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  Pt on Xarelto x 1 month and now will start Tikosyn.  Pt hopes to be able to get insurance in January and has the pt assistance forms from the afib clinic to fill out for assistance from the drug companies. Pt independent and lives alone.   Action/Plan: Checked with pt's pharmacy, [facility] ([address] [place], [place] [phone]).  [facility] can get Tikosyn within 2 days.  1 month supply Xarelto 20 mg is $532.99.  1 month supply of dofetilide 500mg  (60) is $513.99.  Pt will receive 7 day supply of Tikosyn from [employer]. Pt will need paper script for 7 days with no refills and script sent to [facility] with refills.  Expected Discharge Date:  03/06/17                     Expected Discharge Plan:  Home/Self Care  In-House Referral:  NA  Discharge planning Services  CM Consult, Medication Assistance  Post Acute Care Choice:  NA Choice offered to:  NA  DME Arranged:  N/A DME Agency:  NA  HH Arranged:  NA HH Agency:  NA  Status of Service: Completed If discussed at Long Length of Stay Meetings, dates discussed:    Additional Comments: 1516 03-06-17 [facility] to [facility] and Cory [name] at the [facility] had discussed with Main pharmacy that 2 week supply to be filled. CM was not notified of this approval. NP Cory [name] to write a new Rx for 14 day supply. Both Patient Assistance Applications have been faxed to Companies. Tikosyn Rx has been sent over to the [facility] just in case company does not approve within the 2 week period and pt can get the generic at a discounted rate of around $300.00. No further needs from CM at this time. Cory Bamberger, RN, BSN     1413  03-06-17 Cory Kommer Graves-Bigelow, RN,BSN [phone] CM did assist with the Rx for 7 day supply of Tikosyn. Tikosyn Patient Assistance Application to be filled out at the office. Hopefully turn-around time will be less than 2 weeks for approval for patient assistance. CM did make pt aware to stay in contact with office in regards to medication. Pt is without PCP at this time. CM did [facility] for [facility] Follow Up. Appointment placed on the AVS. Pt will be able to utilize the Pharmacy at the [facility] for medications that range in cost from $4.00-$10.00. Samples of Xarelto was provided to patient prior to d/c. Xarelto Application to be filled out as well from office. Pt has already utilized the 30 day free Xarelto [name]. Pt states he has a part time job that will assist with medication cost.      1130 03-05-17 Cory Bamberger, RN, BSN [phone]  Tikosyn Load: Pt will need a Rx for 7 day supply Tikosyn without refills and CM will assist with Rx from Main Pharmacy. Pt will also need a Rx with refills. Pt is without any Insurance. Pt has been to the [facility] and Patient Assistance Application provided from Office- once dosage has been confirmed office to assist with Patient Assistance for Tikosyn Supply. CM did reach out to PA in  regards to question if office has samples.

## 2017-03-05 NOTE — Progress Notes (Signed)
Progress Note  Patient Name: Cory [name] Date of Encounter: 03/05/2017  Primary Cardiologist: Mayford Knife  Subjective   No chest pain or sob  Inpatient Medications    Scheduled Meds: . cholecalciferol  1,000 Units Oral Daily  . dofetilide  500 mcg Oral BID  . hydrALAZINE  25 mg Oral Q8H  . lisinopril  40 mg Oral Daily  . metoprolol tartrate  25 mg Oral BID  . multivitamin with minerals  1 tablet Oral Daily  . potassium chloride  40 mEq Oral Daily  . rivaroxaban  20 mg Oral Q supper  . sodium chloride flush  3 mL Intravenous Q12H  . sodium chloride flush  3 mL Intravenous Q12H   Continuous Infusions: . sodium chloride    . sodium chloride 250 mL (03/05/17 0811)   PRN Meds: sodium chloride, hydrocortisone cream, sodium chloride flush, sodium chloride flush   Vital Signs    Vitals:   03/04/17 1815 03/04/17 1951 03/05/17 0446 03/05/17 0759  BP: (!) 158/106 (!) 146/109 (!) 147/98 (!) 140/102  Pulse:  69 65 70  Resp:  18 17   Temp:  98.6 F (37 C) 97.9 F (36.6 C)   TempSrc:  Oral Oral   SpO2:  100% 100%   Weight:   248 lb 3.8 oz (112.6 kg)   Height:        Intake/Output Summary (Last 24 hours) at 03/05/17 0813 Last data filed at 03/05/17 0500  Gross per 24 hour  Intake             1480 ml  Output                0 ml  Net             1480 ml   Filed Weights   03/03/17 1442 03/04/17 0500 03/05/17 0446  Weight: 248 lb 9.6 oz (112.8 kg) 248 lb (112.5 kg) 248 lb 3.8 oz (112.6 kg)    Telemetry    Atrial fib to NSR with occaisional PVC's - Personally Reviewed  ECG    NSR with QTC 470 - Personally Reviewed  Physical Exam   GEN: No acute distress.   Neck: No JVD Cardiac: RRR, no murmurs, rubs, or gallops.  Respiratory: Clear to auscultation bilaterally. GI: Soft, nontender, non-distended  MS: No edema; No deformity. Neuro:  Nonfocal  Psych: Normal affect   Labs    Chemistry Recent Labs Lab 03/03/17 0959  03/04/17 0101 03/05/17 0327  03/05/17 0639  NA 140  --  138 137  --   K 3.7  < > 4.2 3.9 4.0  CL 102  --  104 102  --   CO2 32  --  28 28  --   GLUCOSE 95  --  104* 103*  --   BUN 13  --  15 13  --   CREATININE 1.12  --  1.17 1.04  --   CALCIUM 9.4  --  8.8* 8.9  --   GFRNONAA >60  --  >60 >60  --   GFRAA >60  --  >60 >60  --   ANIONGAP 6  --  6 7  --   < > = values in this interval not displayed.   HematologyNo results for input(s): WBC, RBC, HGB, HCT, MCV, MCH, MCHC, RDW, PLT in the last 168 hours.  Cardiac EnzymesNo results for input(s): TROPONINI in the last 168 hours. No results for input(s): TROPIPOC in the last 168  hours.   BNPNo results for input(s): BNP, PROBNP in the last 168 hours.   DDimer No results for input(s): DDIMER in the last 168 hours.   Radiology    No results found.  Cardiac Studies   none  Patient Profile     61 y.o. male admitted for Tikosyn admit, converted to NSR  Assessment & Plan    1. Persistent atrial fib - he has reverted to NSR. His QTC is around 470. Will continue dofetilide and plan to DC after tomorrow morning dose.  2. Hypokalemia - replete 3. Chronic systolic heart failure - he appears euvolemic. No change in meds. For questions or updates, please contact [facility] Please consult [email or web address] for contact info under Cardiology/STEMI.      Signed, Lewayne BuntingGregg Conlin Brahm, MD  03/05/2017, 8:13 AM  Patient ID: Cory RudKenneth A [name], male   DOB: 06/26/1955, 61 y.o.   MRN: [id number]

## 2017-03-06 LAB — MAGNESIUM: MAGNESIUM: 2 mg/dL (ref 1.7–2.4)

## 2017-03-06 LAB — BASIC METABOLIC PANEL
Anion gap: 6 (ref 5–15)
BUN: 14 mg/dL (ref 6–20)
CALCIUM: 8.9 mg/dL (ref 8.9–10.3)
CO2: 27 mmol/L (ref 22–32)
CREATININE: 1.02 mg/dL (ref 0.61–1.24)
Chloride: 104 mmol/L (ref 101–111)
GFR calc Af Amer: >60 mL/min (ref >60)
Glucose, Bld: 105 mg/dL — ABNORMAL HIGH (ref 65–99)
Potassium: 4.2 mmol/L (ref 3.5–5.1)
SODIUM: 137 mmol/L (ref 135–145)

## 2017-03-06 MED ORDER — DOFETILIDE 125 MCG PO CAPS: 375.0000 ug | ORAL_CAPSULE | Freq: Two times a day (BID) | ORAL | 1 refills | Status: DC

## 2017-03-06 MED ORDER — DOFETILIDE 250 MCG PO CAPS
375.0000 ug | ORAL_CAPSULE | Freq: Two times a day (BID) | ORAL | Status: DC
  Filled 2017-03-06: qty 14

## 2017-03-06 NOTE — Procedures (Signed)
   Patient Name: Cory [name], Cory [name] Date: 02/24/2017 Gender: Male D.O.B: Sep 24, 1955 Age (years): 61 Referring Provider: Joline Salthonda G Barrett PA-C Height (inches): 73 Interpreting Physician: Armanda Magicraci Mariona Scholes MD, ABSM Weight (lbs): 250 RPSGT: Shelah LewandowskyGregory, [name] BMI: 33 MRN: [id number] Neck Size: 17.00  CLINICAL INFORMATION Sleep [name] Type: NPSG  Indication for sleep [name]: Congestive Heart Failure, Hypertension, Obesity, Snoring  Epworth Sleepiness Score: 8  SLEEP [name] TECHNIQUE As per the AASM Manual for the Scoring of Sleep and Associated Events v2.3 (April 2016) with a hypopnea requiring 4% desaturations.  The channels recorded and monitored were frontal, central and occipital EEG, electrooculogram (EOG), submentalis EMG (chin), nasal and oral airflow, thoracic and abdominal wall motion, anterior tibialis EMG, snore microphone, electrocardiogram, and pulse oximetry.  MEDICATIONS Medications self-administered by patient taken the night of the [name] : N/A  SLEEP ARCHITECTURE The [name] was initiated at 11:06:20 PM and ended at 5:28:25 AM.  Sleep onset time was 25.6 minutes and the sleep efficiency was 57.3%. The total sleep time was 219.0 minutes.  Stage REM latency was 267.5 minutes.  The patient spent 18.26% of the night in stage N1 sleep, 63.47% in stage N2 sleep, 0.00% in stage N3 and 18.26% in REM.  Alpha intrusion was absent.  Supine sleep was 43.33%.  RESPIRATORY PARAMETERS The overall apnea/hypopnea index (AHI) was 19.7 per hour. There were 2 total apneas, including 0 obstructive, 2 central and 0 mixed apneas. There were 70 hypopneas and 29 RERAs.  The AHI during Stage REM sleep was 6.0 per hour.  AHI while supine was 33.5 per hour.  The mean oxygen saturation was 94.15%. The minimum SpO2 during sleep was 87.00%.  soft snoring was noted during this [name].  CARDIAC DATA The 2 lead EKG demonstrated atrial fibrillation. The mean heart rate was 67.56 beats per  minute. Other EKG findings include: PVCs.  LEG MOVEMENT DATA The total PLMS were 2 with a resulting PLMS index of 0.55. Associated arousal with leg movement index was 0.3 .  IMPRESSIONS - Moderate obstructive sleep apnea occurred during this [name] (AHI = 19.7/h). - No significant central sleep apnea occurred during this [name] (CAI = 0.5/h). - Mild oxygen desaturation was noted during this [name] (Min O2 = 87.00%). - The patient snored with soft snoring volume. - EKG findings include PVCs. - Clinically significant periodic limb movements did not occur during sleep. No significant associated arousals.  DIAGNOSIS - Obstructive Sleep Apnea (327.23 [G47.33 ICD-10])  RECOMMENDATIONS - Therapeutic CPAP titration to determine optimal pressure required to alleviate sleep disordered breathing. - Positional therapy avoiding supine position during sleep. - Avoid alcohol, sedatives and other CNS depressants that may worsen sleep apnea and disrupt normal sleep architecture. - Sleep hygiene should be reviewed to assess factors that may improve sleep quality. - Weight management and regular exercise should be initiated or continued if appropriate.  Armanda Magicraci Angeni Chaudhuri Diplomate, American Board of Sleep Medicine  ELECTRONICALLY SIGNED ON:  03/06/2017, 10:03 PM [facility] PH: [phone]   FX: [phone] ACCREDITED BY THE AMERICAN ACADEMY OF SLEEP MEDICINE

## 2017-03-06 NOTE — Discharge Summary (Signed)
ELECTROPHYSIOLOGY PROCEDURE DISCHARGE SUMMARY    Patient ID: Cory [name],  MRN: [id number], DOB/AGE: Sep 29, 1955 61 y.o.  Admit date: 03/03/2017 Discharge date: 03/06/2017  Primary Care Physician: Patient, No Pcp Per Primary Cardiologist: Mayford Knife Electrophysiologist: Ladona Ridgel  Primary Discharge Diagnosis:  1.  Persistent atrial fibrillation status post Tikosyn loading this admission  Secondary Discharge Diagnosis:  1.  HTN 2.  ETOH abuse 3.  LV dysfunction   No Known Allergies   Procedures This Admission:  1.  Tikosyn loading  Brief HPI: Cory [name] is a 61 y.o. male with a past medical history as noted above.  They were referred to the AF clinic in the outpatient setting for treatment options of atrial fibrillation.  Risks, benefits, and alternatives to Tikosyn were reviewed with the patient who wished to proceed.    Hospital Course:  The patient was admitted and Tikosyn was initiated.  Renal function and electrolytes were followed during the hospitalization.  Their QTc initially lengthened but was stable on of Tikosyn.  They were monitored until discharge on telemetry which demonstrated sinus rhythm.  On the day of discharge, they were examined by Dr Ladona Ridgel who considered them stable for discharge to home.  Follow-up has been arranged with AF clinic in 1 week and with Dr Ladona Ridgel in 4 weeks.   Physical Exam: Vitals:   03/05/17 1430 03/05/17 2109 03/05/17 2259 03/06/17 0604  BP: (!) 144/83 (!) 153/98  (!) 160/109  Pulse: (!) 56 60 (!) 58   Resp: 18 17    Temp: 97.6 F (36.4 C) 97.6 F (36.4 C)    TempSrc: Oral Oral    SpO2: 99%     Weight:      Height:        GEN- The patient is well appearing, alert and oriented x 3 today.   HEENT: normocephalic, atraumatic; sclera clear, conjunctiva pink; hearing intact; oropharynx clear; neck supple Lungs- Clear to ausculation bilaterally, normal work of breathing.  No wheezes, rales, rhonchi Heart- Regular rate  and rhythm  GI- soft, non-tender, non-distended, bowel sounds present  Extremities- no clubbing, cyanosis, or edema  MS- no significant deformity or atrophy Skin- warm and dry, no rash or lesion Psych- euthymic mood, full affect Neuro- strength and sensation are intact   Labs:   Lab Results  Component Value Date   WBC 8.5 12/28/2016   HGB 16.7 12/28/2016   HCT 48.8 12/28/2016   MCV 83.8 12/28/2016   PLT 279 12/28/2016     Recent Labs Lab 03/06/17 0348  NA 137  K 4.2  CL 104  CO2 27  BUN 14  CREATININE 1.02  CALCIUM 8.9  GLUCOSE 105*     Discharge Medications:  Allergies as of 03/06/2017   No Known Allergies     Medication List    TAKE these medications   cholecalciferol 1000 units tablet Commonly known as:  VITAMIN D Take 1,000 Units by mouth daily.   dofetilide 125 MCG capsule Commonly known as:  TIKOSYN Take 3 capsules (375 mcg total) by mouth 2 (two) times daily.   lisinopril 20 MG tablet Commonly known as:  PRINIVIL,ZESTRIL Take 20 mg by mouth daily.   metoprolol tartrate 25 MG tablet Commonly known as:  LOPRESSOR Take 1 tablet (25 mg total) by mouth 2 (two) times daily.   multivitamin with minerals Tabs tablet Take 1 tablet by mouth daily.   potassium chloride 10 MEQ tablet Commonly known as:  K-DUR Take 1 tablet (  10 mEq total) by mouth daily.   rivaroxaban 20 MG Tabs tablet Commonly known as:  XARELTO Take 1 tablet (20 mg total) by mouth daily with supper.       Disposition:  Discharge Instructions    Diet - low sodium heart healthy    Complete by:  As directed    Increase activity slowly    Complete by:  As directed      Follow-up Information    [facility] Follow up on 03/13/2017.   Specialty:  Cardiology Why:  at [facility] Contact information: [address] [id number] [place] [id number] [phone]       Marinus Mawaylor, Wane Mollett W, MD Follow up on 05/20/2017.   Specialty:   Cardiology Why:  at 11:45AM Contact information: [address] [place] [address] [phone]           Duration of Discharge Encounter: Greater than 30 minutes including physician time.  Signed, Gypsy BalsamAmber Seiler, NP 03/06/2017 11:20 AM  EP Attending  Patient seen and examined. Agree with above. The patient has had an improvement in the QT interval on Dofetilide 375 mg q12. I can be discharged home. We will plan the followup as usual. He will need a repeat 2D echo in 3 months. He is on guidline directed medical therapy for his LV dysfunction. His CHF symptoms are class 1.   Leonia ReevesGregg Bryar Dahms,M.D.

## 2017-03-11 ENCOUNTER — Telehealth: Payer: Self-pay | Admitting: *Deleted

## 2017-03-11 NOTE — Telephone Encounter (Signed)
-----   Message from Quintella Reichertraci R Turner, MD sent at 03/06/2017 10:06 PM EDT ----- Please let patient know that they have sleep apnea and recommend CPAP titration. Please set up titration in the sleep lab.

## 2017-03-11 NOTE — Telephone Encounter (Signed)
LMTCB

## 2017-03-12 NOTE — Telephone Encounter (Signed)
LMTCB

## 2017-03-13 ENCOUNTER — Ambulatory Visit (HOSPITAL_COMMUNITY)
Admit: 2017-03-13 | Discharge: 2017-03-13 | Disposition: A | Discharge status: Home / Self Care | Payer: Self-pay | Attending: Nurse Practitioner | Admitting: Nurse Practitioner

## 2017-03-13 ENCOUNTER — Encounter (HOSPITAL_COMMUNITY): Payer: Self-pay | Admitting: Nurse Practitioner

## 2017-03-13 VITALS — BP 156/98 | HR 63 | Ht 73.0 in | Wt 252.0 lb

## 2017-03-13 DIAGNOSIS — I481 Persistent atrial fibrillation: Secondary | ICD-10-CM | POA: Insufficient documentation

## 2017-03-13 DIAGNOSIS — I509 Heart failure, unspecified: Secondary | ICD-10-CM | POA: Insufficient documentation

## 2017-03-13 DIAGNOSIS — I110 Hypertensive heart disease with heart failure: Secondary | ICD-10-CM | POA: Insufficient documentation

## 2017-03-13 DIAGNOSIS — L409 Psoriasis, unspecified: Secondary | ICD-10-CM | POA: Insufficient documentation

## 2017-03-13 DIAGNOSIS — I4819 Other persistent atrial fibrillation: Secondary | ICD-10-CM

## 2017-03-13 LAB — BASIC METABOLIC PANEL
Anion gap: 6 (ref 5–15)
BUN: 20 mg/dL (ref 6–20)
CALCIUM: 9.1 mg/dL (ref 8.9–10.3)
CO2: 28 mmol/L (ref 22–32)
Chloride: 104 mmol/L (ref 101–111)
Creatinine, Ser: 1.04 mg/dL (ref 0.61–1.24)
GFR calc Af Amer: >60 mL/min (ref >60)
GLUCOSE: 130 mg/dL — AB (ref 65–99)
Potassium: 3.9 mmol/L (ref 3.5–5.1)
Sodium: 138 mmol/L (ref 135–145)

## 2017-03-13 LAB — MAGNESIUM: Magnesium: 1.8 mg/dL (ref 1.7–2.4)

## 2017-03-13 NOTE — Addendum Note (Signed)
Encounter addended by: Marcy SirenBrennan, Abagale Boulos S, LCSW on: 03/13/2017 1:34 PM  Actions taken: Sign clinical note

## 2017-03-13 NOTE — Progress Notes (Signed)
Primary Care Physician: Patient, No Pcp Per Referring Physician: Dr. Lorelle Formosa is a 61 y.o. male with a h/o hypertension went to urgent care for upper respiratory infection and was found to be in atrial fibrillation and was sent to the ER, 12/28/2016, also had hypertensive urgency. He said dyspnea on exertion for about 3 weeks.CHADSVASC=2 HTN & CHF.2-D echo showed mildly dilated LV with severe LVH, moderate concentric hypertrophy and moderately to severe reduced LV function EF 30-35%. Left atrium was severely dilated and mild MR. He was discharged home on Xarelto and beta blocker with plans for cardioversion in 3 weeks if still in atrial fibrillation. He had cardioversion 10/1 but had early return of afib after 3-4 days. He felt better in SR with more energy and less dyspnea. Unfortunately on f/u with Herma Carson, PA, yesterday, he was back in afib.   He is in the afib office to discuss options to restore SR. He is pending sleep study next week. He drinks minimal alcohol, no significant caffeine or tobacco. 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 He is pending getting insurance as of January 1.  F/u in afib clinic, 11/8, after hospitalization for Tikosyn loading. Qtc lengthened on 500 mcg bid and dose was reduced to 375 mcg daily. Qtc today is 446 ms. He feels improved with better energy and shortness of breath. He is receiving his drug free thru the Tikosyn assistance program. He noticed a few hours of afib  Tuesday am.  Today, he denies symptoms of palpitations, chest pain, shortness of breath, orthopnea, PND, lower extremity edema, dizziness, presyncope, syncope, or neurologic sequela. The patient is tolerating medications without difficulties and is otherwise without complaint today.   Past Medical History:  Diagnosis Date  . Atrial fibrillation with controlled ventricular rate (HCC) 12/28/2016  . Atrial fibrillation, persistent (HCC)  12/28/2016  . Chronic systolic CHF (congestive heart failure) (HCC) 12/30/2016  . Elevated troponin 12/28/2016  . HTN (hypertension)   . Hypertensive urgency 12/28/2016  . Hypokalemia 12/28/2016  . Psoriasis    Past Surgical History:  Procedure Laterality Date  . KNEE ARTHROCENTESIS      Current Outpatient Medications  Medication Sig Dispense Refill  . cholecalciferol (VITAMIN D) 1000 units tablet Take 1,000 Units by mouth daily.    Marland Kitchen dofetilide (TIKOSYN) 125 MCG capsule Take 3 capsules (375 mcg total) by mouth 2 (two) times daily. 180 capsule 1  . lisinopril (PRINIVIL,ZESTRIL) 20 MG tablet Take 20 mg by mouth daily.    . metoprolol tartrate (LOPRESSOR) 25 MG tablet Take 1 tablet (25 mg total) by mouth 2 (two) times daily. 180 tablet 3  . Multiple Vitamin (MULTIVITAMIN WITH MINERALS) TABS tablet Take 1 tablet by mouth daily.    . potassium chloride (K-DUR) 10 MEQ tablet Take 1 tablet (10 mEq total) by mouth daily. 30 tablet 3  . rivaroxaban (XARELTO) 20 MG TABS tablet Take 1 tablet (20 mg total) by mouth daily with supper. 90 tablet 3   No current facility-administered medications for this encounter.     No Known Allergies  Social History   Socioeconomic History  . Marital status: Single    Spouse name: Not on file  . Number of children: Not on file  . Years of education: Not on file  . Highest education level: Not on file  Social Needs  . Financial resource strain: Not on file  . Food insecurity - worry: Not on file  .  Food insecurity - inability: Not on file  . Transportation needs - medical: Not on file  . Transportation needs - non-medical: Not on file  Occupational History  . 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 Smoking status: Never Smoker  . Smokeless tobacco: Never Used  Substance and Sexual Activity  . Alcohol use: Yes    Comment: 1-2 drinks/week  . Drug use: No  . Sexual activity: Not on file  Other Topics Concern  . Not on  file  Social History Narrative   [place]    Family History  Problem Relation Age of Onset  . Diabetes Mother   . GI Disease Mother   . Cancer Mother   . Heart failure Mother        from rheumatic fever  . Obesity Sister   . Other Father        choked on a steak    ROS- All systems are reviewed and negative except as per the HPI above  Physical Exam: Vitals:   03/13/17 0912  Weight: 252 lb (114.3 kg)  Height: 6\' 1"  (1.854 m)   Wt Readings from Last 3 Encounters:  03/13/17 252 lb (114.3 kg)  03/05/17 248 lb 3.8 oz (112.6 kg)  03/03/17 249 lb 12 oz (113.3 kg)    Labs: Lab Results  Component Value Date   NA 137 03/06/2017   K 4.2 03/06/2017   CL 104 03/06/2017   CO2 27 03/06/2017   GLUCOSE 105 (H) 03/06/2017   BUN 14 03/06/2017   CREATININE 1.02 03/06/2017   CALCIUM 8.9 03/06/2017   MG 2.0 03/06/2017   No results found for: INR Lab Results  Component Value Date   CHOL 162 12/29/2016   HDL 52 12/29/2016   LDLCALC 99 12/29/2016   TRIG 53 12/29/2016     GEN- The patient is well appearing, alert and oriented x 3 today.   Head- normocephalic, atraumatic Eyes-  Sclera clear, conjunctiva pink Ears- hearing intact Oropharynx- clear Neck- supple, no JVP Lymph- no cervical lymphadenopathy Lungs- Clear to ausculation bilaterally, normal work of breathing Heart- regular rate and rhythm, no murmurs, rubs or gallops, PMI not laterally displaced GI- soft, NT, ND, + BS Extremities- no clubbing, cyanosis, or edema MS- no significant deformity or atrophy Skin- no rash or lesion Psych- euthymic mood, full affect Neuro- strength and sensation are intact  EKG-Sinus rhythm with PAC, pr int  200 ms, qrs int 100 ms, qtc 446 ms Epic records reviewed Echo- 12/29/16-Study Conclusions  - Left ventricle: The cavity size was mildly dilated. Wall   thickness was increased in a pattern of severe LVH. There was   moderate concentric hypertrophy. Systolic  function was moderately   to severely reduced. The estimated ejection fraction was in the   range of 30% to 35%. Wall motion was normal; there were no   regional wall motion abnormalities. Doppler parameters are   consistent with restrictive physiology, indicative of decreased   left ventricular diastolic compliance and/or increased left   atrial pressure. - Aortic valve: Trileaflet; normal thickness leaflets. There was no   regurgitation. - Aortic root: The aortic root was normal in size. - Mitral valve: There was mild regurgitation. - Left atrium: The atrium was severely dilated. 61mm - Right ventricle: The cavity size was normal. Wall thickness was   normal. Systolic function was normal. - Right atrium: The atrium was normal in size. - Tricuspid valve: There  was mild regurgitation. - Pulmonary arteries: Systolic pressure was moderately increased.   PA peak pressure: 50 mm Hg (S). - Inferior vena cava: The vessel was normal in size. - Pericardium, extracardiac: A trivial pericardial effusion was   identified posterior to the heart. Features were not consistent   with tamponade physiology.   Assessment and Plan: 1. Persistent symptomatic afib Successful loading of Tiksoyn with restoring SR, and  improvement of symptoms  General precautions again reviewed with drug Continue dofetilide 375 mg bid  Continue xarelto with a chadsvasc score of at least 2 bmet/mag  2. HTN Elevated in clinic today He is not checking BP's at home and will start keeping records to take to new PCP, when he establishes on 11/12 Continue lisinopril 20 mg daily  Avoid salt  F/u with Dr. Ladona Ridgelaylor 1/15  Elvina Sidleonna C. Matthew Folksarroll, ANP-C [facility] [address] [place], [place] [address] [phone]

## 2017-03-13 NOTE — Progress Notes (Signed)
CSW met with patient in the clinic to discuss options for insurance. Patient has an upcoming appointment at [facility] for primary care and will follow up with [facility] card application if possible during that visit. CSW also provided patient with [facility] Discount program application if needed. Patient verbalizes understanding of follow up and will return call to CSW if needed. Raquel Sarna, [place], [place]

## 2017-03-14 ENCOUNTER — Other Ambulatory Visit (HOSPITAL_COMMUNITY): Payer: Self-pay | Admitting: *Deleted

## 2017-03-14 DIAGNOSIS — I4819 Other persistent atrial fibrillation: Secondary | ICD-10-CM

## 2017-03-14 MED ORDER — POTASSIUM CHLORIDE ER 10 MEQ PO TBCR: 20.0000 meq | EXTENDED_RELEASE_TABLET | Freq: Every day | ORAL | 3 refills | Status: DC

## 2017-03-14 MED ORDER — MAGNESIUM 200 MG PO TABS: 200.0000 mg | ORAL_TABLET | Freq: Every day | ORAL | Status: AC

## 2017-03-17 ENCOUNTER — Ambulatory Visit (INDEPENDENT_AMBULATORY_CARE_PROVIDER_SITE_OTHER): Payer: Self-pay | Admitting: Family Medicine

## 2017-03-17 ENCOUNTER — Encounter: Payer: Self-pay | Admitting: Family Medicine

## 2017-03-17 VITALS — BP 138/78 | HR 56 | Temp 98.4°F | Resp 16 | Ht 73.0 in | Wt 250.0 lb

## 2017-03-17 DIAGNOSIS — Z1159 Encounter for screening for other viral diseases: Secondary | ICD-10-CM

## 2017-03-17 DIAGNOSIS — I10 Essential (primary) hypertension: Secondary | ICD-10-CM

## 2017-03-17 DIAGNOSIS — I481 Persistent atrial fibrillation: Secondary | ICD-10-CM

## 2017-03-17 DIAGNOSIS — Z23 Encounter for immunization: Secondary | ICD-10-CM

## 2017-03-17 DIAGNOSIS — I4819 Other persistent atrial fibrillation: Secondary | ICD-10-CM

## 2017-03-17 LAB — BASIC METABOLIC PANEL
BUN: 21 mg/dL (ref 7–25)
CHLORIDE: 102 mmol/L (ref 98–110)
CO2: 28 mmol/L (ref 20–32)
CREATININE: 1.21 mg/dL (ref 0.70–1.25)
Calcium: 9.8 mg/dL (ref 8.6–10.3)
Glucose, Bld: 91 mg/dL (ref 65–99)
POTASSIUM: 4.9 mmol/L (ref 3.5–5.3)
Sodium: 141 mmol/L (ref 135–146)

## 2017-03-17 LAB — MAGNESIUM: Magnesium: 2.4 mg/dL (ref 1.5–2.5)

## 2017-03-17 MED ORDER — METOPROLOL TARTRATE 25 MG PO TABS: 25.0000 mg | ORAL_TABLET | Freq: Two times a day (BID) | ORAL | 3 refills | Status: DC

## 2017-03-17 MED ORDER — RIVAROXABAN 20 MG PO TABS: 20.0000 mg | ORAL_TABLET | Freq: Every day | ORAL | 3 refills | Status: DC

## 2017-03-17 MED ORDER — LISINOPRIL 20 MG PO TABS: 20.0000 mg | ORAL_TABLET | Freq: Every day | ORAL | 5 refills | Status: DC

## 2017-03-17 NOTE — Patient Instructions (Addendum)
Thanks for establishing care.  Will review all previous hospital notes and laboratory values.  Recommend that you follow-up in the A. fib clinic as scheduled.  No medication changes are warranted today.  Please maintain a diary with blood pressures and pulse and follow-up in 4 weeks.  Will repeat EKG at follow-up appointment.  Blood pressure goal is less than 140/90.  Normal heart rate is between 60 and 100. - Continue medication, monitor blood pressure at home. Continue DASH diet. Reminder to go to the ER if any CP, SOB, nausea, dizziness, severe HA, changes vision/speech, left arm numbness and tingling and jaw pain.    Any post lower than 50 is abnormal, please notify office if that occurs. We will follow-up by phone with any abnormal laboratory results.  You received a tetanus and influenza vaccination on today without complication.   [facility].    Please complete financial assistance forms and return to appropriate location.  Health maintenance: Recommend screening colonoscopy Recommend prostate exam yearly Recommend dental exam twice yearly Recommend yearly flu vaccination Recommend tetanus vaccination every 10 years Recommend yearly physical exam Recommend routine ophthalmology visit yearly    DASH Eating Plan DASH stands for "Dietary Approaches to Stop Hypertension." The DASH eating plan is a healthy eating plan that has been shown to reduce high blood pressure (hypertension). It may also reduce your risk for type 2 diabetes, heart disease, and stroke. The DASH eating plan may also help with weight loss. What are tips for following this plan? General guidelines  Avoid eating more than 2,300 mg (milligrams) of salt (sodium) a day. If you have hypertension, you may need to reduce your sodium intake to 1,500 mg a day.  Limit alcohol intake to no more than 1 drink a day for nonpregnant women and 2 drinks a day for  men. One drink equals 12 oz of beer, 5 oz of wine, or 1 oz of hard liquor.  Work with your health care provider to maintain a healthy body weight or to lose weight. Ask what an ideal weight is for you.  Get at least 30 minutes of exercise that causes your heart to beat faster (aerobic exercise) most days of the week. Activities may include walking, swimming, or biking.  Work with your health care provider or diet and nutrition specialist (dietitian) to adjust your eating plan to your individual calorie needs. Reading food labels  Check food labels for the amount of sodium per serving. Choose foods with less than 5 percent of the Daily Value of sodium. Generally, foods with less than 300 mg of sodium per serving fit into this eating plan.  To find whole grains, look for the word "whole" as the first word in the ingredient list. Shopping  Buy products labeled as "low-sodium" or "no salt added."  Buy fresh foods. Avoid canned foods and premade or frozen meals. Cooking  Avoid adding salt when cooking. Use salt-free seasonings or herbs instead of table salt or sea salt. Check with your health care provider or pharmacist before using salt substitutes.  Do not fry foods. Cook foods using healthy methods such as baking, boiling, grilling, and broiling instead.  Cook with heart-healthy oils, such as olive, canola, soybean, or sunflower oil. Meal planning   Eat a balanced diet that includes: ? 5 or more servings of fruits and vegetables each day. At each meal, try to fill half of your plate with fruits and vegetables. ?  Up to 6-8 servings of whole grains each day. ? Less than 6 oz of lean meat, poultry, or fish each day. A 3-oz serving of meat is about the same size as a deck of cards. One egg equals 1 oz. ? 2 servings of low-fat dairy each day. ? A serving of nuts, seeds, or beans 5 times each week. ? Heart-healthy fats. Healthy fats called Omega-3 fatty acids are found in foods such as  flaxseeds and coldwater fish, like sardines, salmon, and mackerel.  Limit how much you eat of the following: ? Canned or prepackaged foods. ? Food that is high in trans fat, such as fried foods. ? Food that is high in saturated fat, such as fatty meat. ? Sweets, desserts, sugary drinks, and other foods with added sugar. ? Full-fat dairy products.  Do not salt foods before eating.  Try to eat at least 2 vegetarian meals each week.  Eat more home-cooked food and less restaurant, buffet, and fast food.  When eating at a restaurant, ask that your food be prepared with less salt or no salt, if possible. What foods are recommended? The items listed may not be a complete list. Talk with your dietitian about what dietary choices are best for you. Grains Whole-grain or whole-wheat bread. Whole-grain or whole-wheat pasta. Brown rice. Modena Morrow. Bulgur. Whole-grain and low-sodium cereals. Pita bread. Low-fat, low-sodium crackers. Whole-wheat flour tortillas. Vegetables Fresh or frozen vegetables (raw, steamed, roasted, or grilled). Low-sodium or reduced-sodium tomato and vegetable juice. Low-sodium or reduced-sodium tomato sauce and tomato paste. Low-sodium or reduced-sodium canned vegetables. Fruits All fresh, dried, or frozen fruit. Canned fruit in natural juice (without added sugar). Meat and other protein foods Skinless chicken or [place]. Ground chicken or [place]. Pork with fat trimmed off. Fish and seafood. Egg whites. Dried beans, peas, or lentils. Unsalted nuts, nut butters, and seeds. Unsalted canned beans. Lean cuts of beef with fat trimmed off. Low-sodium, lean deli meat. Dairy Low-fat (1%) or fat-free (skim) milk. Fat-free, low-fat, or reduced-fat cheeses. Nonfat, low-sodium ricotta or cottage cheese. Low-fat or nonfat yogurt. Low-fat, low-sodium cheese. Fats and oils Soft margarine without trans fats. Vegetable oil. Low-fat, reduced-fat, or light mayonnaise and salad dressings  (reduced-sodium). Canola, safflower, olive, soybean, and sunflower oils. Avocado. Seasoning and other foods Herbs. Spices. Seasoning mixes without salt. Unsalted popcorn and pretzels. Fat-free sweets. What foods are not recommended? The items listed may not be a complete list. Talk with your dietitian about what dietary choices are best for you. Grains Baked goods made with fat, such as croissants, muffins, or some breads. Dry pasta or rice meal packs. Vegetables Creamed or fried vegetables. Vegetables in a cheese sauce. Regular canned vegetables (not low-sodium or reduced-sodium). Regular canned tomato sauce and paste (not low-sodium or reduced-sodium). Regular tomato and vegetable juice (not low-sodium or reduced-sodium). Angie Fava. Olives. Fruits Canned fruit in a light or heavy syrup. Fried fruit. Fruit in cream or butter sauce. Meat and other protein foods Fatty cuts of meat. Ribs. Fried meat. Berniece Salines. Sausage. Bologna and other processed lunch meats. Salami. Fatback. Hotdogs. Bratwurst. Salted nuts and seeds. Canned beans with added salt. Canned or smoked fish. Whole eggs or egg yolks. Chicken or [place] with skin. Dairy Whole or 2% milk, cream, and half-and-half. Whole or full-fat cream cheese. Whole-fat or sweetened yogurt. Full-fat cheese. Nondairy creamers. Whipped toppings. Processed cheese and cheese spreads. Fats and oils Butter. Stick margarine. Lard. Shortening. Ghee. Bacon fat. Tropical oils, such as coconut, palm kernel, or palm oil.  Seasoning and other foods Salted popcorn and pretzels. Onion salt, garlic salt, seasoned salt, table salt, and sea salt. Worcestershire sauce. Tartar sauce. Barbecue sauce. Teriyaki sauce. Soy sauce, including reduced-sodium. Steak sauce. Canned and packaged gravies. Fish sauce. Oyster sauce. Cocktail sauce. Horseradish that you find on the shelf. Ketchup. Mustard. Meat flavorings and tenderizers. Bouillon cubes. Hot sauce and Tabasco sauce. Premade or  packaged marinades. Premade or packaged taco seasonings. Relishes. Regular salad dressings. Where to find more information:  National Heart, Lung, and Blood Institute: PopSteam.iswww.nhlbi.nih.gov  American Heart Association: www.heart.org Summary  The DASH eating plan is a healthy eating plan that has been shown to reduce high blood pressure (hypertension). It may also reduce your risk for type 2 diabetes, heart disease, and stroke.  With the DASH eating plan, you should limit salt (sodium) intake to 2,300 mg a day. If you have hypertension, you may need to reduce your sodium intake to 1,500 mg a day.  When on the DASH eating plan, aim to eat more fresh fruits and vegetables, whole grains, lean proteins, low-fat dairy, and heart-healthy fats.  Work with your health care provider or diet and nutrition specialist (dietitian) to adjust your eating plan to your individual calorie needs. This information is not intended to replace advice given to you by your health care provider. Make sure you discuss any questions you have with your health care provider. Document Released: 04/11/2011 Document Revised: 04/15/2016 Document Reviewed: 04/15/2016 Elsevier Interactive Patient Education  2017 [employer]

## 2017-03-17 NOTE — Progress Notes (Signed)
Cory [name], 61 year old male with a history of atrial fibrillation presents to establish care.  Patient has not had a primary provider due to financial and insurance constraints.  He has mostly been using the emergency department for all primary care needs.  He states that he was in his usual state of health prior to presenting to urgent care in August 2018.  He was found to have atrial fibrillation on EKG and was sent to the emergency department.  Cardioversion was performed at that time, which was unsuccessful.  Patient has been undergoing Tikosyn therapy for Afib, which is utilized to treat highly symptomatic atrial fibrillation or atrial flutter.  He is scheduled to establish care with the atrial fibrillation clinic in 4 weeks.  Patient has been checking blood pressures daily and recording heart rates consistently.  He is also on Xarelto 20 mg daily.  He denies any signs of bleeding.  Blood pressure has been fairly controlled on medication regimen.  He continues to check blood pressures at home consistently.  He is also following a low-fat, low carbohydrate diet divided over 5-6 small meals.  Patient has also started a routine exercise regimen.  He currently denies headache, dizziness, paresthesias, palpitations, bilateral lower extremity edema or syncope.  Cardiac risk factors include history of atrial fibrillation, chronic systolic heart failure.  Past Medical History:  Diagnosis Date  . Atrial fibrillation with controlled ventricular rate (HCC) 12/28/2016  . Atrial fibrillation, persistent (HCC) 12/28/2016  . Chronic systolic CHF (congestive heart failure) (HCC) 12/30/2016  . Elevated troponin 12/28/2016  . HTN (hypertension)   . Hypertensive urgency 12/28/2016  . Hypokalemia 12/28/2016  . Psoriasis    Social History   Socioeconomic History  . Marital status: Single    Spouse name: Not on file  . Number of children: Not on file  . Years of education: Not on file  . Highest education level:  Not on file  Social Needs  . Financial resource strain: Not on file  . Food insecurity - worry: Not on file  . Food insecurity - inability: Not on file  . Transportation needs - medical: Not on file  . Transportation needs - non-medical: Not on file  Occupational History  . 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 Smoking status: Never Smoker  . Smokeless tobacco: Never Used  Substance and Sexual Activity  . Alcohol use: Yes    Comment: 1-2 drinks/week  . Drug use: No  . Sexual activity: Not on file  Other Topics Concern  . Not on file  Social History Narrative   [place]  Review of Systems  Constitutional: Negative for diaphoresis and malaise/fatigue.  HENT: Negative.   Eyes: Negative.   Respiratory: Negative.   Cardiovascular: Negative for chest pain, palpitations, orthopnea, claudication, leg swelling and PND.  Gastrointestinal: Negative.   Genitourinary: Negative.   Musculoskeletal: Negative.   Skin: Negative.   Neurological: Negative.  Negative for dizziness, tingling, tremors, focal weakness, seizures, loss of consciousness, weakness and headaches.  Psychiatric/Behavioral: Negative for depression and suicidal ideas.  BP (!) 152/88 (BP Location: Left Arm, Patient Position: Sitting, Cuff Size: Large) Comment: MANUALLY  Pulse (!) 56   Temp 98.4 F (36.9 C) (Oral)   Resp 16   Ht 6\' 1"  (1.854 m)   Wt 250 lb (113.4 kg)   SpO2 99%   BMI 32.98 kg/m    BP 138/78 (BP Location: Left Arm, Patient Position: Sitting, Cuff Size: Large)  Pulse (!) 56   Temp 98.4 F (36.9 C) (Oral)   Resp 16   Ht 6\' 1"  (1.854 m)   Wt 250 lb (113.4 kg)   SpO2 99%   BMI 32.98 kg/m   General Appearance:    Alert, cooperative, no distress, appears stated age  Head:    Normocephalic, without obvious abnormality, atraumatic  Eyes:    PERRL, conjunctiva/corneas clear, EOM's intact, fundi    benign, both eyes       Ears:    Normal TM's and  external ear canals, both ears  Nose:   Nares normal, septum midline, mucosa normal, no drainage   or sinus tenderness  Throat:   Lips, mucosa, and tongue normal; teeth and gums normal  Neck:   Supple, symmetrical, trachea midline, no adenopathy;       thyroid:  No enlargement/tenderness/nodules; no carotid   bruit or JVD  Back:     Symmetric, no curvature, ROM normal, no CVA tenderness  Lungs:     Clear to auscultation bilaterally, respirations unlabored  Chest wall:    No tenderness or deformity  Heart:    Regular rate and rhythm, S1 and S2 normal, no murmur, rub   or gallop  Abdomen:     Soft, non-tender, bowel sounds active all four quadrants,    no masses, no organomegaly  Extremities:   Extremities normal, atraumatic, no cyanosis or edema  Pulses:   2+ and symmetric all extremities  Skin:   Skin color, texture, turgor normal, no rashes or lesions  Lymph nodes:   Cervical, supraclavicular, and axillary nodes normal  Neurologic:   CNII-XII intact. Normal strength, sensation and reflexes      throughout   Plan   1. Persistent atrial fibrillation (HCC) Patient is to continue Tikosyn for heart failure clinic.  We will check renal functioning and magnesium level on today Results will be available to cardiology. - Basic Metabolic Panel - Magnesium  2. Essential hypertension Blood pressure is at goal on current medication regimen no changes warranted on today.  We will forward labs to cardiology.  No proteinuria on urinalysis.  Will review renal functioning as results become available. - Orthostatic vital signs - lisinopril (PRINIVIL,ZESTRIL) 20 MG tablet; Take 1 tablet (20 mg total) daily by mouth.  Dispense: 30 tablet; Refill: 5 - metoprolol tartrate (LOPRESSOR) 25 MG tablet; Take 1 tablet (25 mg total) 2 (two) times daily by mouth.  Dispense: 180 tablet; Refill: 3 - Basic Metabolic Panel - Magnesium  3. Influenza vaccination given - Flu Vaccine QUAD 36+ mos IM (Fluarix &  Fluzone Quad PF  4. Need for Tdap vaccination - Tdap vaccine greater than or equal to 7yo IM  5. Need for hepatitis C screening test - Hepatitis C Antibody   Health maintenance:.   Recommend that patient complete financial assistance forms Recommend a yearly ophthalmology exam Recommend screening colonoscopy Recommend prostate exam Patient is up-to-date on vaccinations    Beckam Abdulaziz Rennis PettyMoore Airyn Ellzey  MSN, FNP-C [facility] [address]  [place], [place] [address] [phone]

## 2017-03-17 NOTE — Telephone Encounter (Signed)
Informed patient of sleep study results and patient understanding was verbalized. Patient understands he has sleep apnea and that Dr Mayford Knifeurner recommends a CPAP Titration. Patient wants to discuss his sleep sstudy test with his PCP before consenting to take the CPAP Titration. Patient will call our office back after his PCP visit.

## 2017-03-18 LAB — HEPATITIS C ANTIBODY
Hepatitis C Ab: NONREACTIVE
SIGNAL TO CUT-OFF: 0.03 (ref <1.00)

## 2017-03-18 MED FILL — TIKOSYN 125 MCG CAPSULE: 125 | 5 days supply | Qty: 30 | Fill #0

## 2017-04-16 ENCOUNTER — Ambulatory Visit: Payer: Self-pay | Admitting: Family Medicine

## 2017-04-30 DIAGNOSIS — L409 Psoriasis, unspecified: Secondary | ICD-10-CM | POA: Insufficient documentation

## 2017-05-14 ENCOUNTER — Other Ambulatory Visit (HOSPITAL_COMMUNITY): Payer: Self-pay | Admitting: *Deleted

## 2017-05-14 DIAGNOSIS — I4819 Other persistent atrial fibrillation: Secondary | ICD-10-CM

## 2017-05-14 MED ORDER — RIVAROXABAN 20 MG PO TABS: 20.0000 mg | ORAL_TABLET | Freq: Every day | ORAL | 3 refills | Status: DC

## 2017-05-20 ENCOUNTER — Ambulatory Visit: Payer: BLUE CROSS/BLUE SHIELD | Admitting: Internal Medicine

## 2017-05-20 ENCOUNTER — Encounter: Payer: Self-pay | Admitting: Internal Medicine

## 2017-05-20 VITALS — BP 136/88 | HR 48 | Ht 73.0 in | Wt 245.0 lb

## 2017-05-20 DIAGNOSIS — I481 Persistent atrial fibrillation: Secondary | ICD-10-CM

## 2017-05-20 DIAGNOSIS — I10 Essential (primary) hypertension: Secondary | ICD-10-CM

## 2017-05-20 DIAGNOSIS — I5022 Chronic systolic (congestive) heart failure: Secondary | ICD-10-CM | POA: Diagnosis not present

## 2017-05-20 DIAGNOSIS — I4819 Other persistent atrial fibrillation: Secondary | ICD-10-CM

## 2017-05-20 NOTE — Progress Notes (Signed)
HPI 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 He had a 2D echo in the summer with an EF of 35% and was in persistent atrial fib with a RVR. He was admitted and started on dofetilide and reverted back to NSR. He feels well and has no limit to exertion. He notes rare palpitations and his energy level is good. No chest pain or sob. No edema.  No Known Allergies   Current Outpatient Medications  Medication Sig Dispense Refill  . cholecalciferol (VITAMIN D) 1000 units tablet Take 1,000 Units by mouth daily.    Marland Kitchen dofetilide (TIKOSYN) 125 MCG capsule Take 3 capsules (375 mcg total) by mouth 2 (two) times daily. 180 capsule 1  . lisinopril (PRINIVIL,ZESTRIL) 20 MG tablet Take 1 tablet (20 mg total) daily by mouth. 30 tablet 5  . Magnesium 200 MG TABS Take 1 tablet (200 mg total) daily by mouth. 30 each   . metoprolol tartrate (LOPRESSOR) 25 MG tablet Take 1 tablet (25 mg total) 2 (two) times daily by mouth. 180 tablet 3  . Multiple Vitamin (MULTIVITAMIN WITH MINERALS) TABS tablet Take 1 tablet by mouth daily.    . potassium chloride (K-DUR) 10 MEQ tablet Take 2 tablets (20 mEq total) daily by mouth. 60 tablet 3  . rivaroxaban (XARELTO) 20 MG TABS tablet Take 1 tablet (20 mg total) by mouth daily with supper. 90 tablet 3   No current facility-administered medications for this visit.      Past Medical History:  Diagnosis Date  . Atrial fibrillation with controlled ventricular rate (HCC) 12/28/2016  . Atrial fibrillation, persistent (HCC) 12/28/2016  . Chronic systolic CHF (congestive heart failure) (HCC) 12/30/2016  . Elevated troponin 12/28/2016  . HTN (hypertension)   . Hypertensive urgency 12/28/2016  . Hypokalemia 12/28/2016  . Psoriasis     ROS:   All systems reviewed and negative except as noted in the HPI.   Past Surgical History:  Procedure Laterality Date  . CARDIOVERSION N/A 02/03/2017   Procedure: CARDIOVERSION;  Surgeon: Pricilla Riffle, MD;  Location: [facility] ENDOSCOPY;  Service: Cardiovascular;  Laterality: N/A;  . KNEE ARTHROCENTESIS       Family History  Problem Relation Age of Onset  . Diabetes Mother   . GI Disease Mother   . Cancer Mother   . Heart failure Mother        from rheumatic fever  . Obesity Sister   . Other Father        choked on a steak     Social History   Socioeconomic History  . Marital status: Single    Spouse name: Not on file  . Number of children: Not on file  . Years of education: Not on file  . Highest education level: Not on file  Social Needs  . Financial resource strain: Not on file  . Food insecurity - worry: Not on file  . Food insecurity - inability: Not on file  . Transportation needs - medical: Not on file  . Transportation needs - non-medical: Not on file  Occupational History  . Occupation: Emergency planning/management officer for a [employer]  Tobacco Use  . Smoking status: Never Smoker  . Smokeless tobacco: Never Used  Substance and Sexual Activity  . Alcohol use: Yes    Comment: 1-2 drinks/week  . Drug use: No  . Sexual activity: Not on file  Other Topics Concern  . Not on file  Social History Narrative   Pt lives alone in [place]     BP 136/88   Pulse (!) 48   Ht 6\' 1"  (1.854 m)   Wt 245 lb (111.1 kg)   BMI 32.32 kg/m   Physical Exam:  Well appearing 62 yo man, NAD HEENT: Unremarkable Neck:  6 cm JVD, no thyromegally Lymphatics:  No adenopathy Back:  No CVA tenderness Lungs:  Clear with no wheezes HEART:  Regular brady rhythm, no murmurs, no rubs, no clicks Abd:  soft, positive bowel sounds, no organomegally, no rebound, no guarding Ext:  2 plus pulses, no edema, no cyanosis, no clubbing Skin:  No rashes no nodules Neuro:  CN II through XII intact, motor grossly intact  EKG - sinus brady at 48/min  DEVICE  Normal device function.  See PaceArt for details.   Assess/Plan: 1. Persistent atrial fib - he is maintaining NSR very nicely. He  will continue dofetilide 2. 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 I have asked him to repeat his 2D echo to confirm that his pumping function has improved. 3. Sinus node dysfunction - he is slow but is asymptomatic. Will follow. 4. HTN - his blood pressure is a little high but is better at home. We will follow.   Cory ReevesGregg [name],M.D.

## 2017-05-20 NOTE — Patient Instructions (Addendum)
Medication Instructions:  Your physician recommends that you continue on your current medications as directed. Please refer to the Current Medication list given to you today.  Labwork: Your physician has requested that you have an echocardiogram. Echocardiography is a painless test that uses sound waves to create images of your heart. It provides your doctor with information about the size and shape of your heart and how well your heart's chambers and valves are working. This procedure takes approximately one hour. There are no restrictions for this procedure.  Please schedule for an echo in February.    Testing/Procedures: None ordered.  Follow-Up: Your physician wants you to follow-up in: 6 months with Dr Ladona Ridgelaylor. You will receive a reminder letter in the mail two months in advance. If you don't receive a letter, please call our office to schedule the follow-up appointment.  Any Other Special Instructions Will Be Listed Below (If Applicable).     If you need a refill on your cardiac medications before your next appointment, please call your pharmacy.

## 2017-06-23 ENCOUNTER — Other Ambulatory Visit: Payer: Self-pay

## 2017-06-23 ENCOUNTER — Ambulatory Visit (HOSPITAL_COMMUNITY): Payer: BLUE CROSS/BLUE SHIELD | Attending: Internal Medicine

## 2017-06-23 DIAGNOSIS — I340 Nonrheumatic mitral (valve) insufficiency: Secondary | ICD-10-CM | POA: Insufficient documentation

## 2017-06-23 DIAGNOSIS — I10 Essential (primary) hypertension: Secondary | ICD-10-CM

## 2017-06-23 DIAGNOSIS — I5022 Chronic systolic (congestive) heart failure: Secondary | ICD-10-CM | POA: Diagnosis not present

## 2017-06-23 DIAGNOSIS — I481 Persistent atrial fibrillation: Secondary | ICD-10-CM | POA: Diagnosis not present

## 2017-06-23 DIAGNOSIS — I110 Hypertensive heart disease with heart failure: Secondary | ICD-10-CM | POA: Insufficient documentation

## 2017-06-23 DIAGNOSIS — I4819 Other persistent atrial fibrillation: Secondary | ICD-10-CM

## 2017-06-26 ENCOUNTER — Telehealth: Payer: Self-pay

## 2017-06-26 NOTE — Telephone Encounter (Signed)
error 

## 2017-07-15 ENCOUNTER — Other Ambulatory Visit (HOSPITAL_COMMUNITY): Payer: Self-pay | Admitting: Nurse Practitioner

## 2017-07-23 NOTE — Telephone Encounter (Signed)
Call pt to discuss what PCP advised 07/22/17 Per pt. Call back on 07/23/17

## 2017-08-08 NOTE — Telephone Encounter (Signed)
Patient returned phone call to inform me that he never discussed with his PCP about having the CPAP titration but would consider having the test if he could have a different mask option then what he had during his initial Sleep Study as he states he could barely sleep and was very uncomfortable with the mask he wore for his Split Night study.   I advised the patient to cal [facility] to inquire about mask options and to see if they have something that would better suite him in order to get the titration completed. He is aware and agreeable with checking with [facility] and calling back with is decision. I gave him the number to [facility] as well as Ms. Nina's direct line. He was appreciative for my help.

## 2017-09-12 ENCOUNTER — Telehealth (HOSPITAL_COMMUNITY): Payer: Self-pay | Admitting: *Deleted

## 2017-09-12 ENCOUNTER — Other Ambulatory Visit (HOSPITAL_COMMUNITY): Payer: Self-pay | Admitting: *Deleted

## 2017-09-12 MED ORDER — DOFETILIDE 125 MCG PO CAPS: 375.0000 ug | ORAL_CAPSULE | Freq: Two times a day (BID) | ORAL | 3 refills | Status: DC

## 2017-09-12 NOTE — Telephone Encounter (Signed)
Patient approved for tikosyn patient assistance ID # [id number] through 05/05/18

## 2017-11-07 ENCOUNTER — Other Ambulatory Visit (HOSPITAL_COMMUNITY): Payer: Self-pay | Admitting: Nurse Practitioner

## 2018-01-19 ENCOUNTER — Other Ambulatory Visit: Payer: Self-pay | Admitting: Physician Assistant

## 2018-01-19 NOTE — Telephone Encounter (Signed)
Pt's pharmacy is requesting a refill on lisinopril and metoprolol. Would Dr. Ladona Ridgelaylor like to refill these medications? Pt has an upcoming appt with Dr. Ladona Ridgelaylor. Please address

## 2018-02-09 ENCOUNTER — Other Ambulatory Visit (HOSPITAL_COMMUNITY): Payer: Self-pay | Admitting: *Deleted

## 2018-02-09 MED ORDER — DOFETILIDE 250 MCG PO CAPS: ORAL_CAPSULE | ORAL | 3 refills | Status: DC

## 2018-02-09 MED ORDER — DOFETILIDE 125 MCG PO CAPS: ORAL_CAPSULE | ORAL | 3 refills | Status: DC

## 2018-02-18 ENCOUNTER — Telehealth (HOSPITAL_COMMUNITY): Payer: Self-pay | Admitting: *Deleted

## 2018-02-18 NOTE — Telephone Encounter (Signed)
Patient approved for tikosyn patient assistance through 03/07/2019 Patient ID [id number]

## 2018-03-04 ENCOUNTER — Encounter: Payer: Self-pay | Admitting: Internal Medicine

## 2018-03-04 ENCOUNTER — Ambulatory Visit (INDEPENDENT_AMBULATORY_CARE_PROVIDER_SITE_OTHER): Payer: Self-pay | Admitting: Internal Medicine

## 2018-03-04 VITALS — BP 112/82 | HR 70 | Ht 73.0 in | Wt 264.6 lb

## 2018-03-04 DIAGNOSIS — I10 Essential (primary) hypertension: Secondary | ICD-10-CM

## 2018-03-04 DIAGNOSIS — I4819 Other persistent atrial fibrillation: Secondary | ICD-10-CM

## 2018-03-04 DIAGNOSIS — I429 Cardiomyopathy, unspecified: Secondary | ICD-10-CM

## 2018-03-04 NOTE — Patient Instructions (Addendum)
Medication Instructions:  Your physician recommends that you continue on your current medications as directed. Please refer to the Current Medication list given to you today.  If you need a refill on your cardiac medications before your next appointment, please call your pharmacy.   Lab work: None ordered.  If you have labs (blood work) drawn today and your tests are completely normal, you will receive your results only by: Marland Kitchen MyChart Message (if you have MyChart) OR . A paper copy in the mail If you have any lab test that is abnormal or we need to change your treatment, we will call you to review the results.  Testing/Procedures: Your physician has recommended that you wear a holter monitor. Holter monitors are medical devices that record the heart's electrical activity. Doctors most often use these monitors to diagnose arrhythmias. Arrhythmias are problems with the speed or rhythm of the heartbeat. The monitor is a small, portable device. You can wear one while you do your normal daily activities. This is usually used to diagnose what is causing palpitations/syncope (passing out).  Please schedule for a 48 hour holter monitor.  Follow-Up:  Your physician wants you to follow-up in: 6-8 weeks with Dr. Ladona Ridgel.       At [facility], you and your health needs are our priority.  As part of our continuing mission to provide you with exceptional heart care, we have created designated Provider Care Teams.  These Care Teams include your primary Cardiologist (physician) and Advanced Practice Providers (APPs -  Physician Assistants and Nurse Practitioners) who all work together to provide you with the care you need, when you need it.  Any Other Special Instructions Will Be Listed Below (If Applicable).

## 2018-03-04 NOTE — Progress Notes (Signed)
HPI Mr. Monforte returns today for followup of his atrial fib and LV dysfunction. On rhythm control and medical therapy his EF nearly normalized. He has been on dofetilide and returns today for followup. He denies chest pain or sob. No syncope. He has not felt palpitations. No edema. No Known Allergies   Current Outpatient Medications  Medication Sig Dispense Refill  . cholecalciferol (VITAMIN D) 1000 units tablet Take 1,000 Units by mouth daily.    Marland Kitchen dofetilide (TIKOSYN) 125 MCG capsule Take 1 tablet by mouth twice a day along with tablet to total twice a day 180 capsule 3  . lisinopril (PRINIVIL,ZESTRIL) 20 MG tablet Take 1 tablet (20 mg total) by mouth daily. 90 tablet 3  . Magnesium 200 MG TABS Take 1 tablet (200 mg total) daily by mouth. 30 each   . metoprolol tartrate (LOPRESSOR) 25 MG tablet Take 1 tablet (25 mg total) by mouth 2 (two) times daily. 180 tablet 3  . Multiple Vitamin (MULTIVITAMIN WITH MINERALS) TABS tablet Take 1 tablet by mouth daily.    . potassium chloride (K-DUR) 10 MEQ tablet TAKE 2 TABLETS BY MOUTH DAILY 180 tablet 2  . rivaroxaban (XARELTO) 20 MG TABS tablet Take 1 tablet (20 mg total) by mouth daily with supper. 90 tablet 3   No current facility-administered medications for this visit.      Past Medical History:  Diagnosis Date  . Atrial fibrillation with controlled ventricular rate (HCC) 12/28/2016  . Atrial fibrillation, persistent 12/28/2016  . Chronic systolic CHF (congestive heart failure) (HCC) 12/30/2016  . Elevated troponin 12/28/2016  . HTN (hypertension)   . Hypertensive urgency 12/28/2016  . Hypokalemia 12/28/2016  . Psoriasis     ROS:   All systems reviewed and negative except as noted in the HPI.   Past Surgical History:  Procedure Laterality Date  . CARDIOVERSION N/A 02/03/2017   Procedure: CARDIOVERSION;  Surgeon: Pricilla Riffle, MD;  Location: [facility] ENDOSCOPY;  Service: Cardiovascular;  Laterality: N/A;  . KNEE  ARTHROCENTESIS       Family History  Problem Relation Age of Onset  . Diabetes Mother   . GI Disease Mother   . Cancer Mother   . Heart failure Mother        from rheumatic fever  . Obesity Sister   . Other Father        choked on a steak     Social History   Socioeconomic History  . Marital status: Single    Spouse name: Not on file  . Number of children: Not on file  . Years of education: Not on file  . Highest education level: Not on file  Occupational History  . Occupation: Emergency planning/management officer for a [employer]  Social Needs  . Financial resource strain: Not on file  . Food insecurity:    Worry: Not on file    Inability: Not on file  . Transportation needs:    Medical: Not on file    Non-medical: Not on file  Tobacco Use  . Smoking status: Never Smoker  . Smokeless tobacco: Never Used  Substance and Sexual Activity  . Alcohol use: Yes    Comment: 1-2 drinks/week  . Drug use: No  . Sexual activity: Not on file  Lifestyle  . Physical activity:    Days per week: Not on file    Minutes per session: Not on file  . Stress: Not on file  Relationships  . Social  connections:    Talks on phone: Not on file    Gets together: Not on file    Attends religious service: Not on file    Active member of club or organization: Not on file    Attends meetings of clubs or organizations: Not on file    Relationship status: Not on file  . Intimate partner violence:    Fear of current or ex partner: Not on file    Emotionally abused: Not on file    Physically abused: Not on file    Forced sexual activity: Not on file  Other Topics Concern  . Not on file  Social History Narrative   Pt lives alone in [place]     BP 112/82   Pulse 70   Ht 6\' 1"  (1.854 m)   Wt 264 lb 9.6 oz (120 kg)   SpO2 96%   BMI 34.91 kg/m   Physical Exam:  Well appearing 62 yo man, NAD HEENT: Unremarkable Neck:  6 cm JVD, no thyromegally Lymphatics:  No adenopathy Back:  No CVA  tenderness Lungs:  Clear with no wheezes HEART:  Regular rate rhythm, no murmurs, no rubs, no clicks Abd:  soft, positive bowel sounds, no organomegally, no rebound, no guarding Ext:  2 plus pulses, no edema, no cyanosis, no clubbing Skin:  No rashes no nodules Neuro:  CN II through XII intact, motor grossly intact  EKG - atrial fib/flutter with a controlled Vr.    Assess/Plan: 1. Atrial fib - his VR is controlled and he did not know he was out of rhythm. I have recommended he wear a 48 hour holter monitor to see how much of the time he is in vs out of rhythm. If he is out of rhythm, then we will stop dofetilide. If he is in rhythm much of the time then we will continue. 2. Coags - he is tolerating his xarelto with know bleeding. 3. Tachy induced CM - he has not had any CHF symptoms. He will continue his current meds. 4. HTN - he will continue his current meds and maintain a low sodium diet.  Leonia Reeves.D.

## 2018-03-11 ENCOUNTER — Other Ambulatory Visit (HOSPITAL_COMMUNITY): Payer: Self-pay | Admitting: Nurse Practitioner

## 2018-03-16 ENCOUNTER — Other Ambulatory Visit: Payer: Self-pay | Admitting: Internal Medicine

## 2018-03-16 ENCOUNTER — Ambulatory Visit (INDEPENDENT_AMBULATORY_CARE_PROVIDER_SITE_OTHER): Payer: Self-pay

## 2018-03-16 DIAGNOSIS — I480 Paroxysmal atrial fibrillation: Secondary | ICD-10-CM

## 2018-03-16 DIAGNOSIS — I4819 Other persistent atrial fibrillation: Secondary | ICD-10-CM

## 2018-03-23 ENCOUNTER — Encounter (HOSPITAL_COMMUNITY): Payer: Self-pay

## 2018-04-21 ENCOUNTER — Telehealth: Payer: Self-pay | Admitting: Internal Medicine

## 2018-04-21 DIAGNOSIS — I429 Cardiomyopathy, unspecified: Secondary | ICD-10-CM

## 2018-04-21 DIAGNOSIS — I4819 Other persistent atrial fibrillation: Secondary | ICD-10-CM

## 2018-04-21 NOTE — Telephone Encounter (Signed)
Attempted to reach patient but didn't get an answer. Will try again later.

## 2018-04-21 NOTE — Telephone Encounter (Signed)
  Due to change of insurance for next year Mr Cory [name] will have to switch to [facility]. Mr Cory [name] needs Cory [name] to send over a referral to [facility], Cory [name] phone [phone]

## 2018-04-21 NOTE — Telephone Encounter (Signed)
  Patient calling the office for samples of medication:   1.  What medication and dosage are you requesting samples for? rivaroxaban (XARELTO) 20 MG TABS tablet  2.  Are you currently out of this medication? Has enough till 12/23

## 2018-04-22 NOTE — Telephone Encounter (Signed)
Patient aware samples placed at the front desk. 

## 2018-04-22 NOTE — Telephone Encounter (Signed)
Request received from Pt to refer d/t insurance change.    Referral sent per Dr. Ladona Ridgelaylor.

## 2018-05-07 ENCOUNTER — Ambulatory Visit: Payer: Self-pay | Admitting: Internal Medicine

## 2018-05-18 ENCOUNTER — Other Ambulatory Visit (HOSPITAL_COMMUNITY): Payer: Self-pay | Admitting: Nurse Practitioner

## 2018-05-18 DIAGNOSIS — I4819 Other persistent atrial fibrillation: Secondary | ICD-10-CM

## 2018-08-05 ENCOUNTER — Other Ambulatory Visit (HOSPITAL_COMMUNITY): Payer: Self-pay | Admitting: Cardiology

## 2018-08-10 IMAGING — DX DG CHEST 2V
2 series · 2 of 2 positions shown · non-contrast
Comparison: None.

CLINICAL DATA: Cough

EXAM:
CHEST  2 VIEW

[chest pa]
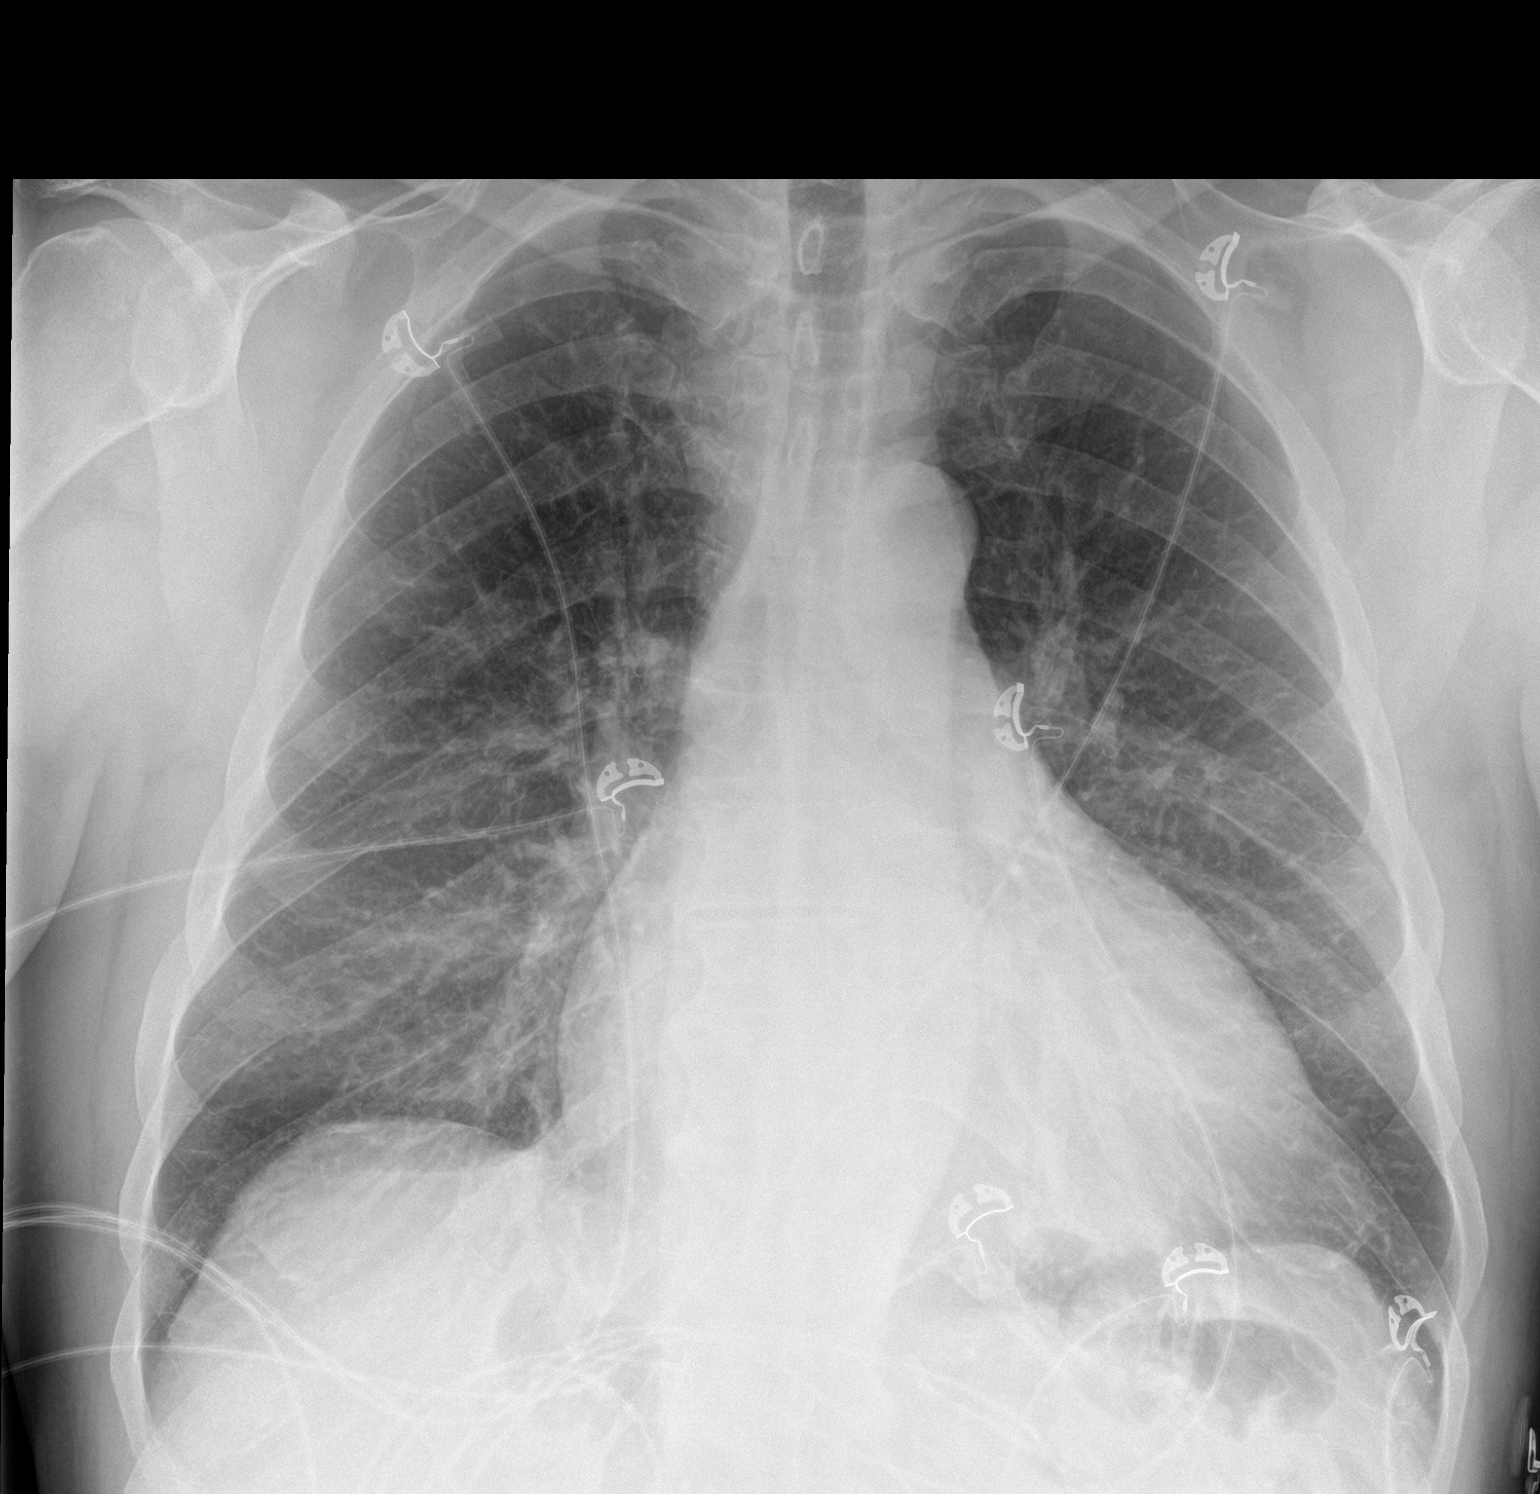

[chest lat]
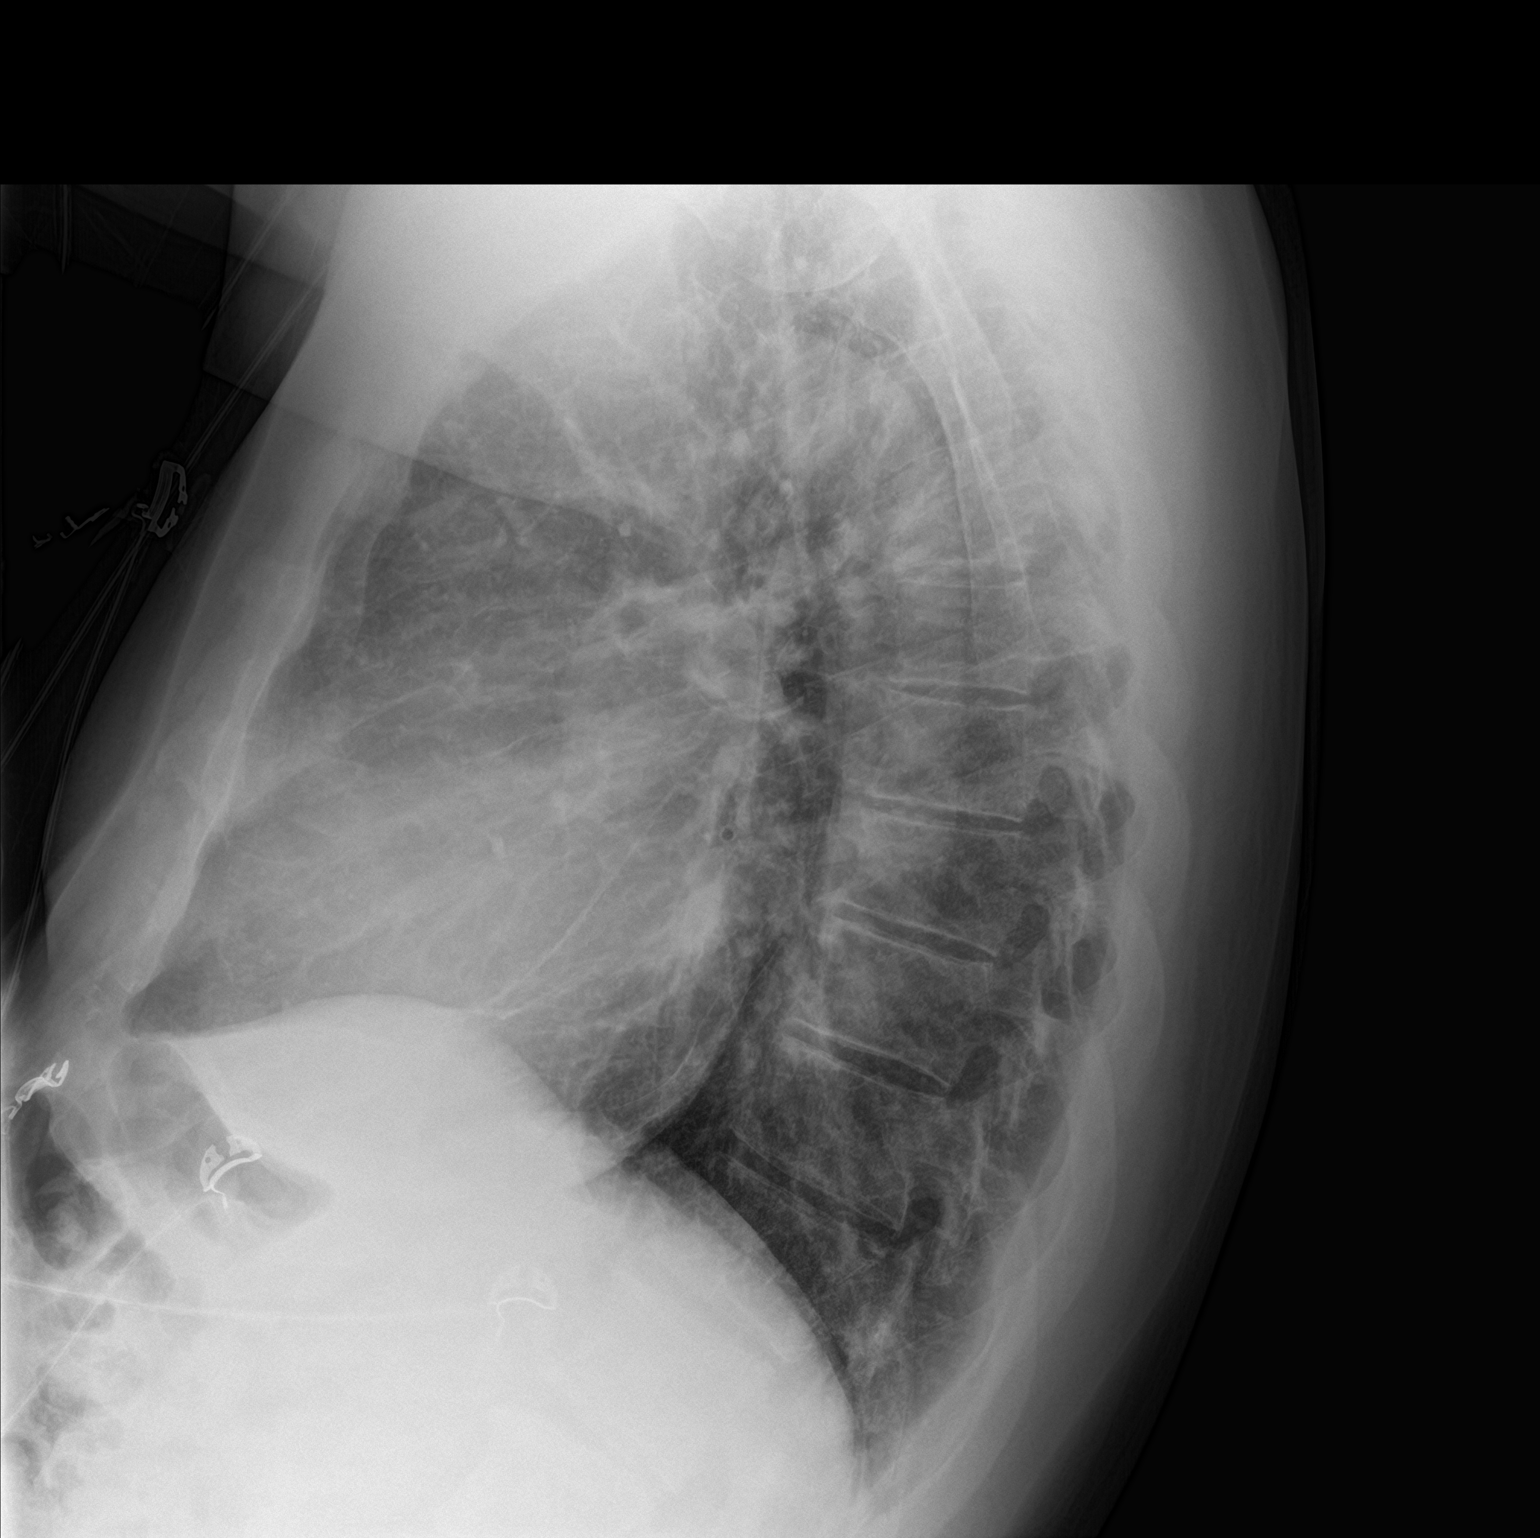

[2 of 2 positions shown; findings below may reference images not displayed]

FINDINGS: Lungs are clear.  No pleural effusion or pneumothorax.

Mild cardiomegaly.

Mild degenerative changes of the visualized thoracolumbar spine.
IMPRESSION: No evidence of acute cardiopulmonary disease.

## 2018-12-30 ENCOUNTER — Other Ambulatory Visit (HOSPITAL_COMMUNITY): Payer: Self-pay | Admitting: Nurse Practitioner

## 2018-12-30 DIAGNOSIS — I4819 Other persistent atrial fibrillation: Secondary | ICD-10-CM

## 2019-01-18 ENCOUNTER — Other Ambulatory Visit: Payer: Self-pay | Admitting: Internal Medicine

## 2019-01-20 ENCOUNTER — Other Ambulatory Visit: Payer: Self-pay | Admitting: Internal Medicine

## 2019-01-28 ENCOUNTER — Other Ambulatory Visit (HOSPITAL_COMMUNITY): Payer: Self-pay | Admitting: Nurse Practitioner

## 2019-01-28 DIAGNOSIS — I4819 Other persistent atrial fibrillation: Secondary | ICD-10-CM

## 2019-03-15 ENCOUNTER — Other Ambulatory Visit: Payer: Self-pay | Admitting: Internal Medicine

## 2019-04-28 ENCOUNTER — Other Ambulatory Visit: Payer: Self-pay

## 2019-07-11 ENCOUNTER — Other Ambulatory Visit: Payer: Self-pay | Admitting: Internal Medicine

## 2019-08-17 ENCOUNTER — Other Ambulatory Visit: Payer: Self-pay | Admitting: Internal Medicine
